# Patient Record
Sex: Male | Born: 1960 | Race: Black or African American | Hispanic: No | Marital: Married | Smoking: Never smoker
Health system: Southern US, Community
[De-identification: ages and names within clinical notes are randomized; demographics above are authoritative.]

## PROBLEM LIST (undated history)

## (undated) DIAGNOSIS — E119 Type 2 diabetes mellitus without complications: Secondary | ICD-10-CM

---

## 2000-09-17 ENCOUNTER — Emergency Department (HOSPITAL_COMMUNITY): Admission: EM | Admit: 2000-09-17 | Discharge: 2000-09-18 | Payer: Self-pay | Admitting: Emergency Medicine

## 2000-09-18 ENCOUNTER — Encounter: Payer: Self-pay | Admitting: Emergency Medicine

## 2006-07-28 ENCOUNTER — Encounter: Admission: RE | Admit: 2006-07-28 | Discharge: 2006-10-26 | Payer: Self-pay | Admitting: Family Medicine

## 2007-01-14 ENCOUNTER — Ambulatory Visit: Payer: Self-pay | Admitting: Internal Medicine

## 2007-01-14 LAB — CONVERTED CEMR LAB
ALT: 36 units/L (ref 0–40)
AST: 25 units/L (ref 0–37)
Albumin: 3.7 g/dL (ref 3.5–5.2)
Alkaline Phosphatase: 38 units/L — ABNORMAL LOW (ref 39–117)
BUN: 11 mg/dL (ref 6–23)
Basophils Absolute: 0 10*3/uL (ref 0.0–0.1)
Basophils Relative: 0.6 % (ref 0.0–1.0)
Bilirubin, Direct: 0.1 mg/dL (ref 0.0–0.3)
CO2: 30 meq/L (ref 19–32)
Calcium: 9.2 mg/dL (ref 8.4–10.5)
Chloride: 108 meq/L (ref 96–112)
Cholesterol: 216 mg/dL (ref 0–200)
Creatinine, Ser: 1.3 mg/dL (ref 0.4–1.5)
Direct LDL: 145.1 mg/dL
Eosinophils Absolute: 0.2 10*3/uL (ref 0.0–0.6)
Eosinophils Relative: 4 % (ref 0.0–5.0)
GFR calc Af Amer: 77 mL/min
GFR calc non Af Amer: 64 mL/min
Glucose, Bld: 112 mg/dL — ABNORMAL HIGH (ref 70–99)
HCT: 43.3 % (ref 39.0–52.0)
HDL: 31.9 mg/dL — ABNORMAL LOW (ref >39.0)
Hemoglobin: 14.5 g/dL (ref 13.0–17.0)
Hgb A1c MFr Bld: 6.6 % — ABNORMAL HIGH (ref 4.6–6.0)
Lymphocytes Relative: 31.6 % (ref 12.0–46.0)
MCHC: 33.6 g/dL (ref 30.0–36.0)
MCV: 92.5 fL (ref 78.0–100.0)
Monocytes Absolute: 0.6 10*3/uL (ref 0.2–0.7)
Monocytes Relative: 15.4 % — ABNORMAL HIGH (ref 3.0–11.0)
Neutro Abs: 1.9 10*3/uL (ref 1.4–7.7)
Neutrophils Relative %: 48.4 % (ref 43.0–77.0)
PSA: 0.74 ng/mL (ref 0.10–4.00)
Platelets: 230 10*3/uL (ref 150–400)
Potassium: 3.5 meq/L (ref 3.5–5.1)
RBC: 4.68 M/uL (ref 4.22–5.81)
RDW: 11.7 % (ref 11.5–14.6)
Sodium: 143 meq/L (ref 135–145)
TSH: 0.82 microintl units/mL (ref 0.35–5.50)
Total Bilirubin: 0.7 mg/dL (ref 0.3–1.2)
Total CHOL/HDL Ratio: 6.8
Total Protein: 6.8 g/dL (ref 6.0–8.3)
Triglycerides: 183 mg/dL — ABNORMAL HIGH (ref 0–149)
VLDL: 37 mg/dL (ref 0–40)
WBC: 3.9 10*3/uL — ABNORMAL LOW (ref 4.5–10.5)

## 2014-08-04 ENCOUNTER — Encounter (HOSPITAL_COMMUNITY): Payer: Self-pay

## 2014-08-04 ENCOUNTER — Emergency Department (HOSPITAL_COMMUNITY): Payer: Managed Care, Other (non HMO)

## 2014-08-04 ENCOUNTER — Observation Stay (HOSPITAL_COMMUNITY)
Admission: EM | Admit: 2014-08-04 | Discharge: 2014-08-05 | Disposition: A | Discharge status: Home / Self Care | Payer: Managed Care, Other (non HMO) | Attending: Internal Medicine | Admitting: Internal Medicine

## 2014-08-04 DIAGNOSIS — R0902 Hypoxemia: Secondary | ICD-10-CM | POA: Diagnosis present

## 2014-08-04 DIAGNOSIS — R61 Generalized hyperhidrosis: Secondary | ICD-10-CM | POA: Insufficient documentation

## 2014-08-04 DIAGNOSIS — R112 Nausea with vomiting, unspecified: Secondary | ICD-10-CM

## 2014-08-04 DIAGNOSIS — R110 Nausea: Secondary | ICD-10-CM | POA: Insufficient documentation

## 2014-08-04 DIAGNOSIS — R109 Unspecified abdominal pain: Secondary | ICD-10-CM | POA: Insufficient documentation

## 2014-08-04 DIAGNOSIS — E119 Type 2 diabetes mellitus without complications: Secondary | ICD-10-CM

## 2014-08-04 DIAGNOSIS — N529 Male erectile dysfunction, unspecified: Secondary | ICD-10-CM | POA: Diagnosis not present

## 2014-08-04 DIAGNOSIS — Z79899 Other long term (current) drug therapy: Secondary | ICD-10-CM | POA: Insufficient documentation

## 2014-08-04 DIAGNOSIS — R55 Syncope and collapse: Principal | ICD-10-CM | POA: Diagnosis present

## 2014-08-04 DIAGNOSIS — R569 Unspecified convulsions: Secondary | ICD-10-CM | POA: Insufficient documentation

## 2014-08-04 DIAGNOSIS — R7989 Other specified abnormal findings of blood chemistry: Secondary | ICD-10-CM

## 2014-08-04 HISTORY — DX: Type 2 diabetes mellitus without complications: E11.9

## 2014-08-04 LAB — CBC
HCT: 41.7 % (ref 39.0–52.0)
HEMOGLOBIN: 14 g/dL (ref 13.0–17.0)
MCH: 29.6 pg (ref 26.0–34.0)
MCHC: 33.6 g/dL (ref 30.0–36.0)
MCV: 88.2 fL (ref 78.0–100.0)
Platelets: 230 10*3/uL (ref 150–400)
RBC: 4.73 MIL/uL (ref 4.22–5.81)
RDW: 11.9 % (ref 11.5–15.5)
WBC: 5.9 10*3/uL (ref 4.0–10.5)

## 2014-08-04 LAB — BASIC METABOLIC PANEL
Anion gap: 16 — ABNORMAL HIGH (ref 5–15)
BUN: 15 mg/dL (ref 6–23)
CHLORIDE: 98 meq/L (ref 96–112)
CO2: 24 mEq/L (ref 19–32)
Calcium: 9.2 mg/dL (ref 8.4–10.5)
Creatinine, Ser: 1.17 mg/dL (ref 0.50–1.35)
GFR calc Af Amer: 82 mL/min — ABNORMAL LOW (ref >90)
GFR calc non Af Amer: 71 mL/min — ABNORMAL LOW (ref >90)
GLUCOSE: 183 mg/dL — AB (ref 70–99)
POTASSIUM: 3.3 meq/L — AB (ref 3.7–5.3)
Sodium: 138 mEq/L (ref 137–147)

## 2014-08-04 MED ORDER — IPRATROPIUM-ALBUTEROL 0.5-2.5 (3) MG/3ML IN SOLN
3.0000 mL | Freq: Once | RESPIRATORY_TRACT | Status: AC
  Administered 2014-08-04: 3 mL via RESPIRATORY_TRACT

## 2014-08-04 NOTE — ED Provider Notes (Signed)
CSN: [id number]     Arrival date & time 08/04/14  2232 History  This chart was scribed for Keith BarretteMarcy Hanish Laraia, MD by Roxy Cedarhandni Bhalodia, ED Scribe. This patient was seen in room B14C/B14C and the patient's care was started at 12:18 AM.   Chief Complaint  Patient presents with  . Seizures   Patient is a 53 y.o. male presenting with seizures. The history is provided by the patient. No language interpreter was used.  Seizures  HPI Comments: Keith Larsen Keith Larsen is a 53 y.o. male with a history of diabetes, who presents to the Emergency Department complaining of a syncope episode that occurred prior to arrival. 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 He states that he did not eat enough throughout the day, although he does report eating. Per wife, patient was in the car and wife was driving out to go shopping when patient stated "I feel funny". He bent over stating that his "stomach was hurting" and he felt nauseous. Per wife, patient began gagging. Per wife, the patient's head fell back and patient became unresponsive for about 3-4 minutes. His head and eyes rolled back, he didn't seem to be breathing, his wife denies any twitching or convulsing of his extremities. No loss of bladder control no tongue laceration. Wife states that he seemed like he was not breathing when he became unresponsive. He reports associated visual disturbance and states that "everything seemed bright and blue". When patient gained consciousness, he was aware of his surroundings and states that he felt weak. Patient currently reports dizziness. Patient denies any prior history of seizures. He denies associated chest pain. He states that his systolic BP when measured at home was 130. No recent illness. Patient was asymptomatic through the day.  Past Medical History  Diagnosis Date  . Diabetes mellitus without complication    History reviewed. No pertinent past surgical history. History reviewed. No pertinent  family history. History  Substance Use Topics  . Smoking status: Never Smoker   . Smokeless tobacco: Not on file  . Alcohol Use: Yes     Comment: occasionally   Review of Systems  Eyes: Positive for visual disturbance.  Cardiovascular: Negative for chest pain and leg swelling.  Neurological: Positive for dizziness, seizures and syncope. Negative for numbness.  10 Systems reviewed and are negative for acute change except as noted in the HPI. Allergies  Review of patient's allergies indicates no known allergies.  Home Medications   Prior to Admission medications   Medication Sig Start Date End Date Taking? Authorizing Provider  glyBURIDE-metformin (GLUCOVANCE) 5-500 MG per tablet Take 1 tablet by mouth 2 (two) times daily with a meal.   Yes Historical Provider, MD  Multiple Vitamin (MULTIVITAMIN WITH MINERALS) TABS tablet Take 1 tablet by mouth daily.   Yes Historical Provider, MD   Triage Vitals: BP 99/67 mmHg  Pulse 80  Temp(Src) 98.2 F (36.8 C) (Oral)  Resp 24  SpO2 94%  Physical Exam  Constitutional: He is oriented to person, place, and time. He appears well-developed and well-nourished.  HENT:  Head: Normocephalic and atraumatic.  Right Ear: External ear normal.  Left Ear: External ear normal.  Nose: Nose normal.  Mouth/Throat: No oropharyngeal exudate.  Eyes: EOM are normal. Pupils are equal, round, and reactive to light.  Neck: Neck supple. No thyromegaly present.  Cardiovascular: Normal rate, regular rhythm, normal heart sounds and intact distal pulses.   Pulmonary/Chest: Effort normal and breath sounds normal.  Abdominal: Soft. Bowel  sounds are normal. He exhibits no distension. There is no tenderness.  Musculoskeletal: Normal range of motion. He exhibits no edema.  Lymphadenopathy:    He has no cervical adenopathy.  Neurological: He is alert and oriented to person, place, and time. He has normal strength. No cranial nerve deficit. He exhibits normal muscle tone.  Coordination normal. GCS eye subscore is 4. GCS verbal subscore is 5. GCS motor subscore is 6.  Skin: Skin is warm, dry and intact.  Psychiatric: He has a normal mood and affect.    ED Course  Procedures   Labs Review Labs Reviewed  BASIC METABOLIC PANEL - Abnormal; Notable for the following:    Potassium 3.3 (*)    Glucose, Bld 183 (*)    GFR calc non Af Amer 71 (*)    GFR calc Af Amer 82 (*)    Anion gap 16 (*)    All other components within normal limits  CBC  HEPATIC FUNCTION PANEL  LIPASE, BLOOD  PROTIME-INR  CBG MONITORING, ED  I-STAT TROPOININ, ED   Imaging Review Ct Head Wo Contrast   (if New Onset Seizure And/or Head Trauma)  08/04/2014   CLINICAL DATA:  Syncope. Fell today. History of diabetes. Seizures.  EXAM: CT HEAD WITHOUT CONTRAST  TECHNIQUE: Contiguous axial images were obtained from the base of the skull through the vertex without intravenous contrast.  COMPARISON:  None.  FINDINGS: Ventricles and sulci appear symmetrical. No mass effect or midline shift. No abnormal extra-axial fluid collections. Gray-white matter junctions are distinct. Basal cisterns are not effaced. No evidence of acute intracranial hemorrhage. No depressed skull fractures. Mucous retention cysts in the left maxillary antrum. Intracranial vascular calcifications. Mastoid air cells are not opacified.  IMPRESSION: No acute intracranial abnormalities. Mucous retention cysts in the left maxillary antrum.   Electronically Signed   By: Burman NievesWilliam  Stevens M.D.   On: 08/04/2014 23:33   Dg Chest Port 1 View  08/05/2014   CLINICAL DATA:  Syncopal episode. Nausea. History of diabetes. Initial encounter.  EXAM: PORTABLE CHEST - 1 VIEW  COMPARISON:  None.  FINDINGS: 0033 hr. The positioning is lordotic. The heart size and mediastinal contours are normal. There is elevation of the right hemidiaphragm with associated mild right basilar atelectasis. The left lung is clear. There is no pleural effusion or  pneumothorax. No acute osseous findings are evident. Telemetry leads overlie the chest.  IMPRESSION: No acute cardiopulmonary process. Mild right basilar atelectasis adjacent to asymmetric elevation of the right hemidiaphragm.   Electronically Signed   By: Roxy HorsemanBill  Veazey M.D.   On: 08/05/2014 00:44     EKG Interpretation   Date/Time:  Saturday August 04 2014 22:37:39 EST Ventricular Rate:  75 PR Interval:  142 QRS Duration: 98 QT Interval:  614 QTC Calculation: 686 R Axis:   53 Text Interpretation:  Sinus rhythm Borderline T wave abnormalities  Prolonged QT interval Baseline wander in lead(s) III agree. abnormal  Confirmed by Donnald GarrePfeiffer, MD, Lebron ConnersMarcy [phone]([id number]) on 08/05/2014 2:09:03 AM      Consult: The patient's case is consult with Dr. Allena KatzPatel of [facility], the patient will be admitted for observation. MDM   Final diagnoses:  Syncope   By description this sounds more like a syncopal episode than a seizure. Patient will be admitted for monitoring and observation.  I personally performed the services described in this documentation, which was scribed in my presence. The recorded information has been reviewed and is accurate.  Keith BarretteMarcy Niles Ess, MD 08/05/14 [phone]

## 2014-08-04 NOTE — ED Notes (Addendum)
Per wife, pt and her were driving out to go shopping when pt stated "I feel funny".  His head reportedly fell back and pt became unresponsive for about "3-4 minutes" with seizure like activity; pt with no hx of seizures per wife; pt is a diabetic.  Pt sts he currently feels dizzy.  Pt somnolent in department and unsteady on feet when standing up.

## 2014-08-05 ENCOUNTER — Observation Stay (HOSPITAL_COMMUNITY): Payer: Managed Care, Other (non HMO)

## 2014-08-05 ENCOUNTER — Emergency Department (HOSPITAL_COMMUNITY): Payer: Managed Care, Other (non HMO)

## 2014-08-05 DIAGNOSIS — E118 Type 2 diabetes mellitus with unspecified complications: Secondary | ICD-10-CM

## 2014-08-05 DIAGNOSIS — R0902 Hypoxemia: Secondary | ICD-10-CM | POA: Diagnosis present

## 2014-08-05 DIAGNOSIS — R55 Syncope and collapse: Secondary | ICD-10-CM

## 2014-08-05 DIAGNOSIS — I517 Cardiomegaly: Secondary | ICD-10-CM

## 2014-08-05 DIAGNOSIS — E119 Type 2 diabetes mellitus without complications: Secondary | ICD-10-CM

## 2014-08-05 LAB — PROTIME-INR
INR: 1.03 (ref 0.00–1.49)
Prothrombin Time: 13.6 seconds (ref 11.6–15.2)

## 2014-08-05 LAB — HEPATIC FUNCTION PANEL
ALBUMIN: 3.5 g/dL (ref 3.5–5.2)
ALK PHOS: 54 U/L (ref 39–117)
ALT: 40 U/L (ref 0–53)
AST: 28 U/L (ref 0–37)
BILIRUBIN TOTAL: 0.4 mg/dL (ref 0.3–1.2)
Bilirubin, Direct: <0.2 mg/dL (ref 0.0–0.3)
Total Protein: 7.3 g/dL (ref 6.0–8.3)

## 2014-08-05 LAB — I-STAT TROPONIN, ED: Troponin i, poc: 0.01 ng/mL (ref 0.00–0.08)

## 2014-08-05 LAB — CBG MONITORING, ED: Glucose-Capillary: 163 mg/dL — ABNORMAL HIGH (ref 70–99)

## 2014-08-05 LAB — HEMOGLOBIN A1C
HEMOGLOBIN A1C: 8.9 % — AB (ref <5.7)
MEAN PLASMA GLUCOSE: 209 mg/dL — AB (ref <117)

## 2014-08-05 LAB — TROPONIN I: Troponin I: <0.3 ng/mL (ref <0.30)

## 2014-08-05 LAB — GLUCOSE, CAPILLARY
Glucose-Capillary: 184 mg/dL — ABNORMAL HIGH (ref 70–99)
Glucose-Capillary: 134 mg/dL — ABNORMAL HIGH (ref 70–99)
Glucose-Capillary: 210 mg/dL — ABNORMAL HIGH (ref 70–99)

## 2014-08-05 LAB — LACTIC ACID, PLASMA: Lactic Acid, Venous: 1.8 mmol/L (ref 0.5–2.2)

## 2014-08-05 LAB — D-DIMER, QUANTITATIVE (NOT AT ARMC): D DIMER QUANT: 0.51 ug{FEU}/mL — AB (ref 0.00–0.48)

## 2014-08-05 LAB — LIPASE, BLOOD
Lipase: 36 U/L (ref 11–59)
Lipase: 23 U/L (ref 11–59)

## 2014-08-05 LAB — PRO B NATRIURETIC PEPTIDE: PRO B NATRI PEPTIDE: 9.4 pg/mL (ref 0–125)

## 2014-08-05 MED ORDER — ONDANSETRON HCL 4 MG/2ML IJ SOLN: 4.0000 mg | Freq: Four times a day (QID) | INTRAMUSCULAR | Status: DC | PRN

## 2014-08-05 MED ORDER — ACETAMINOPHEN 650 MG RE SUPP: 650.0000 mg | Freq: Four times a day (QID) | RECTAL | Status: DC | PRN

## 2014-08-05 MED ORDER — SODIUM CHLORIDE 0.9 % IV SOLN
INTRAVENOUS | Status: AC
  Administered 2014-08-05: 04:00:00 via INTRAVENOUS

## 2014-08-05 MED ORDER — POTASSIUM CHLORIDE CRYS ER 20 MEQ PO TBCR
40.0000 meq | EXTENDED_RELEASE_TABLET | Freq: Once | ORAL | Status: AC
  Administered 2014-08-05: 40 meq via ORAL
  Filled 2014-08-05: qty 2

## 2014-08-05 MED ORDER — ACETAMINOPHEN 325 MG PO TABS: 650.0000 mg | ORAL_TABLET | Freq: Four times a day (QID) | ORAL | Status: DC | PRN

## 2014-08-05 MED ORDER — ENOXAPARIN SODIUM 60 MG/0.6ML ~~LOC~~ SOLN
60.0000 mg | SUBCUTANEOUS | Status: DC
  Filled 2014-08-05: qty 0.6

## 2014-08-05 MED ORDER — INSULIN ASPART 100 UNIT/ML ~~LOC~~ SOLN
0.0000 [IU] | Freq: Three times a day (TID) | SUBCUTANEOUS | Status: DC
  Administered 2014-08-05: 2 [IU] via SUBCUTANEOUS
  Administered 2014-08-05: 5 [IU] via SUBCUTANEOUS
  Administered 2014-08-05: 3 [IU] via SUBCUTANEOUS

## 2014-08-05 MED ORDER — INSULIN ASPART 100 UNIT/ML ~~LOC~~ SOLN: 0.0000 [IU] | Freq: Every day | SUBCUTANEOUS | Status: DC

## 2014-08-05 MED ORDER — ONDANSETRON HCL 4 MG PO TABS: 4.0000 mg | ORAL_TABLET | Freq: Four times a day (QID) | ORAL | Status: DC | PRN

## 2014-08-05 MED ORDER — IOHEXOL 350 MG/ML SOLN
75.0000 mL | Freq: Once | INTRAVENOUS | Status: AC | PRN
  Administered 2014-08-05: 75 mL via INTRAVENOUS

## 2014-08-05 MED ORDER — SODIUM CHLORIDE 0.9 % IJ SOLN: 3.0000 mL | Freq: Two times a day (BID) | INTRAMUSCULAR | Status: DC

## 2014-08-05 NOTE — ED Notes (Signed)
Admitting MD at BS.  

## 2014-08-05 NOTE — Progress Notes (Signed)
*  PRELIMINARY RESULTS* Vascular Ultrasound Lower extremity venous duplex has been completed.  Preliminary findings: no evidence of DVT  Farrel DemarkJill Eunice, RDMS, RVT  08/05/2014, 11:11 AM

## 2014-08-05 NOTE — ED Notes (Signed)
Transporting patient to new room assignment. 

## 2014-08-05 NOTE — Progress Notes (Signed)
  Echocardiogram 2D Echocardiogram has been performed.  Keith Larsen, Keith Larsen 08/05/2014, 12:21 PM

## 2014-08-05 NOTE — H&P (Signed)
[facility] History and Physical  Patient: Keith Larsen  ZOX:[id number]  DOB: 11/07/1960  DOS: the patient was seen and examined on 08/05/2014 PCP: No primary care provider on file.  Chief Complaint: unconscious episode  HPI: Keith Larsen is a 53 y.o. male with Past medical history of diabetes mellitus. The patient is presenting with Unresponsive episode. The episode that has been described by patient is he was in the car with his wife while in the car patient mentions that he was feeling funny later on he started having diffuse sharp abdominal pain then he felt nauseous and diaphoretic and then passed out. He was breathing as if he was gasping for air and was unresponsive for a few minutes with eyes rolled back. He also had some twitching or tremors of the whole body. There was no loss of bowel or bladder function or tongue bite the patient woke up on his own and was not confused. He complained of some fatigue and dizziness and they brought him here in the hospital. While in the hospital patient had another episode of similar symptoms with an episode of vomiting. His blood pressure went to 90s and oxygenation went down with that episode. Patient denies any similar episode in the past. Denies any chest pain shortness of breath orthopnea PND weight gain diarrhea constipation in the past. Patient denies any changes in his medication. 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 He has noted some ankle swelling every now and then.  The patient is coming from home. And at his baseline independent for most of his ADL.  Review of Systems: as mentioned in the history of present illness.  A Comprehensive review of the other systems is negative.  Past Medical History  Diagnosis Date  . Diabetes mellitus without complication    History reviewed. No pertinent past surgical history. Social History:  reports that he has never smoked. He  does not have any smokeless tobacco history on file. He reports that he drinks alcohol. His drug history is not on file.  No Known Allergies  History reviewed. No pertinent family history.  Prior to Admission medications   Medication Sig Start Date End Date Taking? Authorizing Provider  glyBURIDE-metformin (GLUCOVANCE) 5-500 MG per tablet Take 1 tablet by mouth 2 (two) times daily with a meal.   Yes Historical Provider, MD  Multiple Vitamin (MULTIVITAMIN WITH MINERALS) TABS tablet Take 1 tablet by mouth daily.   Yes Historical Provider, MD    Physical Exam: Filed Vitals:   08/05/14 0415 08/05/14 0455 08/05/14 0500 08/05/14 0525  BP: 97/70 115/65 114/72 110/71  Pulse: 83 85 83 90  Temp:    98 F (36.7 C)  TempSrc:    Oral  Resp: 22 19 18 20   Height:    6\' 1"  (1.854 m)  Weight:    131.543 kg (290 lb)  SpO2: 92% 94% 92% 98%    General: Alert, Awake and Oriented to Time, Place and Person. Appear in mild distress Eyes: PERRL ENT: Oral Mucosa clear moist. Neck: difficult to assess JVD Cardiovascular: S1 and S2 Present, no Murmur, Peripheral Pulses Present Respiratory: Bilateral Air entry equal and Decreased, bilateral Crackles, no wheezes Abdomen: Bowel Sound present, Soft and non tender Skin: no Rash Extremities: bilateral Pedal edema, no calf tenderness Neurologic: Grossly no focal neuro deficit.  Labs on Admission:  CBC:  Recent Labs Lab 08/04/14 2243  WBC 5.9  HGB 14.0  HCT  41.7  MCV 88.2  PLT 230    CMP     Component Value Date/Time   NA 138 08/04/2014 2243   K 3.3* 08/04/2014 2243   CL 98 08/04/2014 2243   CO2 24 08/04/2014 2243   GLUCOSE 183* 08/04/2014 2243   BUN 15 08/04/2014 2243   CREATININE 1.17 08/04/2014 2243   CALCIUM 9.2 08/04/2014 2243   PROT 7.3 08/04/2014 2243   ALBUMIN 3.5 08/04/2014 2243   AST 28 08/04/2014 2243   ALT 40 08/04/2014 2243   ALKPHOS 54 08/04/2014 2243   BILITOT 0.4 08/04/2014 2243   GFRNONAA 71* 08/04/2014 2243   GFRAA  82* 08/04/2014 2243     Recent Labs Lab 08/04/14 2243 08/05/14 0410  LIPASE 36 23   No results for input(s): AMMONIA in the last 168 hours.   Recent Labs Lab 08/05/14 0410  TROPONINI <0.30   BNP (last 3 results)  Recent Labs  08/05/14 0410  PROBNP 9.4    Radiological Exams on Admission: Ct Head Wo Contrast   (if New Onset Seizure And/or Head Trauma)  08/04/2014   CLINICAL DATA:  Syncope. Fell today. History of diabetes. Seizures.  EXAM: CT HEAD WITHOUT CONTRAST  TECHNIQUE: Contiguous axial images were obtained from the base of the skull through the vertex without intravenous contrast.  COMPARISON:  None.  FINDINGS: Ventricles and sulci appear symmetrical. No mass effect or midline shift. No abnormal extra-axial fluid collections. Gray-white matter junctions are distinct. Basal cisterns are not effaced. No evidence of acute intracranial hemorrhage. No depressed skull fractures. Mucous retention cysts in the left maxillary antrum. Intracranial vascular calcifications. Mastoid air cells are not opacified.  IMPRESSION: No acute intracranial abnormalities. Mucous retention cysts in the left maxillary antrum.   Electronically Signed   By: Burman NievesWilliam  Stevens M.D.   On: 08/04/2014 23:33   Dg Chest Port 1 View  08/05/2014   CLINICAL DATA:  Syncopal episode. Nausea. History of diabetes. Initial encounter.  EXAM: PORTABLE CHEST - 1 VIEW  COMPARISON:  None.  FINDINGS: 0033 hr. The positioning is lordotic. The heart size and mediastinal contours are normal. There is elevation of the right hemidiaphragm with associated mild right basilar atelectasis. The left lung is clear. There is no pleural effusion or pneumothorax. No acute osseous findings are evident. Telemetry leads overlie the chest.  IMPRESSION: No acute cardiopulmonary process. Mild right basilar atelectasis adjacent to asymmetric elevation of the right hemidiaphragm.   Electronically Signed   By: Roxy HorsemanBill  Veazey M.D.   On: 08/05/2014 00:44    Dg Abd Acute W/chest  08/05/2014   CLINICAL DATA:  Started with severe abdominal pain patient felt funny and head fell back and patient became unresponsive for 3-4 min with seizure like activity. Abdominal pain, nausea and vomiting, hypoxia.  EXAM: ACUTE ABDOMEN SERIES (ABDOMEN 2 VIEW & CHEST 1 VIEW)  COMPARISON:  Chest 08/05/2014  FINDINGS: Shallow inspiration with atelectasis or infiltration in the lung bases. Heart size and pulmonary vascularity are normal for technique. No blunting of costophrenic angles. No pneumothorax.  Scattered gas and stool in the colon. No small or large bowel distention. No free intra-abdominal air. No abnormal air-fluid levels. No radiopaque stones. Visualized bones appear intact.  IMPRESSION: Shallow inspiration with atelectasis or infiltration in the lung bases. Nonobstructive bowel gas pattern.   Electronically Signed   By: Burman NievesWilliam  Stevens M.D.   On: 08/05/2014 04:49    EKG: Independently reviewed. normal sinus rhythm, diffuse T wave inversion.  Assessment/Plan Principal Problem:  Syncope Active Problems:   Hypoxia   Type 2 diabetes mellitus   1. Syncope The patient is presenting with a syncopal episode. He denies any prior cardiac issues or any other abnormality. His EKG shows diffuse T-wave inversions. He has mildly elevated d-dimer. He is hypoxic and hypotensive. Chest x-ray shows that he has basilar atelectasis. He has basal crackles on examination. No abdominal tenderness but symptoms starts with the pain diffuse in the abdomen. With this he has normal lipase and normal lactic acid on recheck as well. Probable etiology is vasovagal syncope but possibility of PE cannot be ruled out due to his history therefore I will obtain a CT chest angiogram as well as lower extremity Doppler. Due to his syncopal episode which could also represent a cardiac event I would continue monitoring him on telemetry and follow serial troponin as well as obtain  echocardiogram.  2. Hypoxia. Etiology currently unclear possible atelectasis versus possible PE versus possible infection. Incentive spirometry. Follow the workup.  3. Type 2 diabetes mellitus. Holding patient's oral hypoglycemic agents and placing him on sliding scale. Patient at present is nothing by mouth.  Advance goals of care discussion: full code  DVT Prophylaxis: subcutaneous Heparin Nutrition: npo  Family Communication: family was present at bedside, opportunity was given to ask question and all questions were answered satisfactorily at the time of interview. Disposition: Admitted to inpatient in telemetry unit.  Author: Lynden Oxford, MD [facility] Pager: [phone] 08/05/2014, 5:35 AM    If 7PM-7AM, please contact night-coverage [email or web address] Password [email or web address]

## 2014-08-05 NOTE — Plan of Care (Signed)
Problem: Phase I Progression Outcomes Goal: Pain controlled with appropriate interventions Outcome: Completed/Met Date Met:  08/05/14     

## 2014-08-05 NOTE — Discharge Instructions (Signed)
Please follow up with Primary care doctor in 3 days Please follow up with cancer center at [facility] in 1 week

## 2014-08-05 NOTE — Discharge Summary (Signed)
Physician Discharge Summary  Ginette Pitmanaul J Batt FAO:[id number] DOB: 02/07/1961 DOA: 08/04/2014  PCP: No primary care provider on file.  Admit date: 08/04/2014 Discharge date: 08/05/2014  Time spent: >35 minutes  Recommendations for Outpatient Follow-up:  F/u with PCP 3-7 days F/u with hem/Oncology in 1 week  Discharge Diagnoses:  Principal Problem:   Syncope Active Problems:   Hypoxia   Type 2 diabetes mellitus   Discharge Condition: stable   Diet recommendation: DM  Filed Weights   08/05/14 0328 08/05/14 0525  Weight: 130.636 kg (288 lb) 131.543 kg (290 lb)    History of present illness:  53 y.o. male with Past medical history of diabetes mellitus presented with Unresponsive episode. The episode that has been described by patient is he was in the car with his wife while in the car patient mentions that he was feeling funny later on he started having diffuse sharp abdominal pain then he felt nauseous and diaphoretic and then passed out.   Hospital Course:  1. Syncope ? Vasovagal vs medication related (Patient reports taking unknown medition for erectile dysfunction 6 hours prior to syncope) -no new symptoms, neuro exam no focal; CT head: no acute findings -echo: LVEF: 60-65%, clinically euvolemic no arrhythmia on tele   2. Mild elevated elevated d dimer; CTA: no PE; leg US no DVT 3. CT chest: Mediastinal, bilateral hilar, and bilateral axillary lymphadenopathy. Findings areconcerning for lymphoma or metastatic disease -d/w patient at length the CT findings, I have recommended to further investigate with oncology consultation; patient prefers to f/u with oncology, and PCP as outpatient for further investigation  4. DM, unclear history; no episodes of hypoglycemia in the hospital; ; pend HA1;   D/w patient again, recommended to monitor overnight; But he prefers to go home and f/u with PCP this week for further management   Procedures:  Echo  Study Conclusions  - Left  ventricle: The cavity size was mildly dilated. Wall thickness was normal. Systolic function was normal. The estimated ejection fraction was in the range of 60% to 65%. Wall motion was normal; there were no regional wall motion abnormalities. Doppler parameters are consistent with abnormal left ventricular relaxation (grade 1 diastolic dysfunction).  Impressions:  - Normal LV function; grade 1 diastolic dysfunction; mild LVE; no significant valvular regurgitation.(i.e. Studies not automatically included, echos, thoracentesis, etc; not x-rays)  Consultations:  none  Discharge Exam: Filed Vitals:   08/05/14 1604  BP: 125/78  Pulse: 88  Temp: 98.4 F (36.9 C)  Resp: 20    General: alert Cardiovascular: s1,s2 rrr Respiratory: CTA BL  Discharge Instructions  Discharge Instructions    Diet - low sodium heart healthy    Complete by:  As directed      Discharge instructions    Complete by:  As directed   Please follow up with Primary care doctor in 3 days Please follow up with cancer center at [facility] in 1 week     Increase activity slowly    Complete by:  As directed             Medication List    TAKE these medications        glyBURIDE-metformin 5-500 MG per tablet  Commonly known as:  GLUCOVANCE  Take 1 tablet by mouth 2 (two) times daily with a meal.     multivitamin with minerals Tabs tablet  Take 1 tablet by mouth daily.       No Known Allergies     Follow-up  Information    Follow up with [facility]    . Schedule an appointment as soon as possible for a visit in 3 days.   Contact information:   [address] [place] [address] [phone]      Follow up with [facility] Schedule an appointment as soon as possible for a visit in 1 week.       The results of significant diagnostics from this hospitalization (including imaging, microbiology, ancillary and  laboratory) are listed below for reference.    Significant Diagnostic Studies: Ct Head Wo Contrast   (if New Onset Seizure And/or Head Trauma)  08/04/2014   CLINICAL DATA:  Syncope. Fell today. History of diabetes. Seizures.  EXAM: CT HEAD WITHOUT CONTRAST  TECHNIQUE: Contiguous axial images were obtained from the base of the skull through the vertex without intravenous contrast.  COMPARISON:  None.  FINDINGS: Ventricles and sulci appear symmetrical. No mass effect or midline shift. No abnormal extra-axial fluid collections. Gray-white matter junctions are distinct. Basal cisterns are not effaced. No evidence of acute intracranial hemorrhage. No depressed skull fractures. Mucous retention cysts in the left maxillary antrum. Intracranial vascular calcifications. Mastoid air cells are not opacified.  IMPRESSION: No acute intracranial abnormalities. Mucous retention cysts in the left maxillary antrum.   Electronically Signed   By: Burman Nieves M.D.   On: 08/04/2014 23:33   Ct Angio Chest Pe W/cm &/or Wo Cm  08/05/2014   CLINICAL DATA:  Syncopal episode yesterday. Hypoxic on room air. Elevated D-dimer.  EXAM: CT ANGIOGRAPHY CHEST WITH CONTRAST  TECHNIQUE: Multidetector CT imaging of the chest was performed using the standard protocol during bolus administration of intravenous contrast. Multiplanar CT image reconstructions and MIPs were obtained to evaluate the vascular anatomy.  CONTRAST:  75mL OMNIPAQUE IOHEXOL 350 MG/ML SOLN  COMPARISON:  None.  FINDINGS: Technically adequate study with good opacification of central and segmental pulmonary arteries. No focal filling defects. No evidence of significant pulmonary embolus.  Normal heart size. Normal caliber thoracic aorta. No aortic dissection. Esophagus is decompressed. There is lymphadenopathy throughout the chest with enlarged lymph nodes in bilateral axillary, bilateral hilar, and mediastinal regions. Largest lymph nodes are present in the right  peritracheal region at 2.5 cm short axis dimension, in the subcarinal region at 3 cm short axis dimension, an in the left aortopulmonic window region at 1.8 cm short axis dimension. Appearance is worrisome for lymphoma or metastatic disease.  Atelectasis in the lung bases. No lung nodules are identified. No pleural effusions. No pneumothorax.  Mildly enlarged lymph nodes demonstrated in the visualize celiac axis and porta hepatis regions. Included portions of the upper abdominal organs are otherwise grossly unremarkable. No destructive bone lesions are identified.  Review of the MIP images confirms the above findings.  IMPRESSION: No evidence of significant pulmonary embolus. Mediastinal, bilateral hilar, and bilateral axillary lymphadenopathy. Findings are concerning for lymphoma or metastatic disease.   Electronically Signed   By: Burman Nieves M.D.   On: 08/05/2014 06:40   Dg Chest Port 1 View  08/05/2014   CLINICAL DATA:  Syncopal episode. Nausea. History of diabetes. Initial encounter.  EXAM: PORTABLE CHEST - 1 VIEW  COMPARISON:  None.  FINDINGS: 0033 hr. The positioning is lordotic. The heart size and mediastinal contours are normal. There is elevation of the right hemidiaphragm with associated mild right basilar atelectasis. The left lung is clear. There is no pleural effusion or pneumothorax. No acute osseous  findings are evident. Telemetry leads overlie the chest.  IMPRESSION: No acute cardiopulmonary process. Mild right basilar atelectasis adjacent to asymmetric elevation of the right hemidiaphragm.   Electronically Signed   By: Roxy HorsemanBill  Veazey M.D.   On: 08/05/2014 00:44   Dg Abd Acute W/chest  08/05/2014   CLINICAL DATA:  Started with severe abdominal pain patient felt funny and head fell back and patient became unresponsive for 3-4 min with seizure like activity. Abdominal pain, nausea and vomiting, hypoxia.  EXAM: ACUTE ABDOMEN SERIES (ABDOMEN 2 VIEW & CHEST 1 VIEW)  COMPARISON:  Chest  08/05/2014  FINDINGS: Shallow inspiration with atelectasis or infiltration in the lung bases. Heart size and pulmonary vascularity are normal for technique. No blunting of costophrenic angles. No pneumothorax.  Scattered gas and stool in the colon. No small or large bowel distention. No free intra-abdominal air. No abnormal air-fluid levels. No radiopaque stones. Visualized bones appear intact.  IMPRESSION: Shallow inspiration with atelectasis or infiltration in the lung bases. Nonobstructive bowel gas pattern.   Electronically Signed   By: Burman NievesWilliam  Stevens M.D.   On: 08/05/2014 04:49    Microbiology: No results found for this or any previous visit (from the past 240 hour(s)).   Labs: Basic Metabolic Panel:  Recent Labs Lab 08/04/14 2243  NA 138  K 3.3*  CL 98  CO2 24  GLUCOSE 183*  BUN 15  CREATININE 1.17  CALCIUM 9.2   Liver Function Tests:  Recent Labs Lab 08/04/14 2243  AST 28  ALT 40  ALKPHOS 54  BILITOT 0.4  PROT 7.3  ALBUMIN 3.5    Recent Labs Lab 08/04/14 2243 08/05/14 0410  LIPASE 36 23   No results for input(s): AMMONIA in the last 168 hours. CBC:  Recent Labs Lab 08/04/14 2243  WBC 5.9  HGB 14.0  HCT 41.7  MCV 88.2  PLT 230   Cardiac Enzymes:  Recent Labs Lab 08/05/14 0410  TROPONINI <0.30   BNP: BNP (last 3 results)  Recent Labs  08/05/14 0410  PROBNP 9.4   CBG:  Recent Labs Lab 08/04/14 2241 08/05/14 0642 08/05/14 1144 08/05/14 1605  GLUCAP 163* 184* 134* 210*       Signed:  Jonette MateBURIEV, Kendria Halberg N  [facility] 08/05/2014, 4:51 PM

## 2014-08-05 NOTE — Progress Notes (Signed)
[facility] PROGRESS NOTE  Ginette Pitmanaul J Gunnels [id number] DOB: 05/01/1961 DOA: 08/04/2014 PCP: No primary care provider on file.  Assessment/Plan: 53 y.o. male with Past medical history of diabetes mellitus presented with Unresponsive episode. The episode that has been described by patient is he was in the car with his wife while in the car patient mentions that he was feeling funny later on he started having diffuse sharp abdominal pain then he felt nauseous and diaphoretic and then passed out.   1. Syncope ? Vasovagal vs medication related (Patient reports taking unknown medition for erectile dysfunction 6 hours prior to syncope) -no new symptoms, neuro exam no focal; CT head: no acute findings -pend echo, monitor on tele; hold glyburide r/o hypoglycemia   2. Mild elevated elevated d dimer; CTA: no PE; pend leg US to r/o DVT 3. CT chest: Mediastinal, bilateral hilar, and bilateral axillary lymphadenopathy. Findings areconcerning for lymphoma or  metastatic disease -d/w patient at length the CT findings, I have recommended to further investigate with oncology consultation; patient prefers to f/u with oncology, and PCP as outpatient for further investigation     4. DM, unclear history; hold gliburide; check HA1c    Code Status: full Family Communication: d/w patient (indicate person spoken with, relationship, and if by phone, the number) Disposition Plan: home 24-48 hrs    Consultants:  none  Procedures:  none  Antibiotics:  none (indicate start date, and stop date if known)  HPI/Subjective: alert  Objective: Filed Vitals:   08/05/14 0525  BP: 110/71  Pulse: 90  Temp: 98 F (36.7 C)  Resp: 20   No intake or output data in the 24 hours ending 08/05/14 0948 Filed Weights   08/05/14 0328 08/05/14 0525  Weight: 130.636 kg (288 lb) 131.543 kg (290 lb)    Exam:   General:  alert  Cardiovascular: s1,s2 rrr  Respiratory: CTA BL  Abdomen: soft, obese,  nt  Musculoskeletal: no Leg edema   Data Reviewed: Basic Metabolic Panel:  Recent Labs Lab 08/04/14 2243  NA 138  K 3.3*  CL 98  CO2 24  GLUCOSE 183*  BUN 15  CREATININE 1.17  CALCIUM 9.2   Liver Function Tests:  Recent Labs Lab 08/04/14 2243  AST 28  ALT 40  ALKPHOS 54  BILITOT 0.4  PROT 7.3  ALBUMIN 3.5    Recent Labs Lab 08/04/14 2243 08/05/14 0410  LIPASE 36 23   No results for input(s): AMMONIA in the last 168 hours. CBC:  Recent Labs Lab 08/04/14 2243  WBC 5.9  HGB 14.0  HCT 41.7  MCV 88.2  PLT 230   Cardiac Enzymes:  Recent Labs Lab 08/05/14 0410  TROPONINI <0.30   BNP (last 3 results)  Recent Labs  08/05/14 0410  PROBNP 9.4   CBG:  Recent Labs Lab 08/04/14 2241 08/05/14 0642  GLUCAP 163* 184*    No results found for this or any previous visit (from the past 240 hour(s)).   Studies: Ct Head Wo Contrast   (if New Onset Seizure And/or Head Trauma)  08/04/2014   CLINICAL DATA:  Syncope. Fell today. History of diabetes. Seizures.  EXAM: CT HEAD WITHOUT CONTRAST  TECHNIQUE: Contiguous axial images were obtained from the base of the skull through the vertex without intravenous contrast.  COMPARISON:  None.  FINDINGS: Ventricles and sulci appear symmetrical. No mass effect or midline shift. No abnormal extra-axial fluid collections. Gray-white matter junctions are distinct. Basal cisterns are not effaced. No evidence of  acute intracranial hemorrhage. No depressed skull fractures. Mucous retention cysts in the left maxillary antrum. Intracranial vascular calcifications. Mastoid air cells are not opacified.  IMPRESSION: No acute intracranial abnormalities. Mucous retention cysts in the left maxillary antrum.   Electronically Signed   By: Burman NievesWilliam  Stevens M.D.   On: 08/04/2014 23:33   Ct Angio Chest Pe W/cm &/or Wo Cm  08/05/2014   CLINICAL DATA:  Syncopal episode yesterday. Hypoxic on room air. Elevated D-dimer.  EXAM: CT ANGIOGRAPHY  CHEST WITH CONTRAST  TECHNIQUE: Multidetector CT imaging of the chest was performed using the standard protocol during bolus administration of intravenous contrast. Multiplanar CT image reconstructions and MIPs were obtained to evaluate the vascular anatomy.  CONTRAST:  75mL OMNIPAQUE IOHEXOL 350 MG/ML SOLN  COMPARISON:  None.  FINDINGS: Technically adequate study with good opacification of central and segmental pulmonary arteries. No focal filling defects. No evidence of significant pulmonary embolus.  Normal heart size. Normal caliber thoracic aorta. No aortic dissection. Esophagus is decompressed. There is lymphadenopathy throughout the chest with enlarged lymph nodes in bilateral axillary, bilateral hilar, and mediastinal regions. Largest lymph nodes are present in the right peritracheal region at 2.5 cm short axis dimension, in the subcarinal region at 3 cm short axis dimension, an in the left aortopulmonic window region at 1.8 cm short axis dimension. Appearance is worrisome for lymphoma or metastatic disease.  Atelectasis in the lung bases. No lung nodules are identified. No pleural effusions. No pneumothorax.  Mildly enlarged lymph nodes demonstrated in the visualize celiac axis and porta hepatis regions. Included portions of the upper abdominal organs are otherwise grossly unremarkable. No destructive bone lesions are identified.  Review of the MIP images confirms the above findings.  IMPRESSION: No evidence of significant pulmonary embolus. Mediastinal, bilateral hilar, and bilateral axillary lymphadenopathy. Findings are concerning for lymphoma or metastatic disease.   Electronically Signed   By: Burman NievesWilliam  Stevens M.D.   On: 08/05/2014 06:40   Dg Chest Port 1 View  08/05/2014   CLINICAL DATA:  Syncopal episode. Nausea. History of diabetes. Initial encounter.  EXAM: PORTABLE CHEST - 1 VIEW  COMPARISON:  None.  FINDINGS: 0033 hr. The positioning is lordotic. The heart size and mediastinal contours are  normal. There is elevation of the right hemidiaphragm with associated mild right basilar atelectasis. The left lung is clear. There is no pleural effusion or pneumothorax. No acute osseous findings are evident. Telemetry leads overlie the chest.  IMPRESSION: No acute cardiopulmonary process. Mild right basilar atelectasis adjacent to asymmetric elevation of the right hemidiaphragm.   Electronically Signed   By: Roxy HorsemanBill  Veazey M.D.   On: 08/05/2014 00:44   Dg Abd Acute W/chest  08/05/2014   CLINICAL DATA:  Started with severe abdominal pain patient felt funny and head fell back and patient became unresponsive for 3-4 min with seizure like activity. Abdominal pain, nausea and vomiting, hypoxia.  EXAM: ACUTE ABDOMEN SERIES (ABDOMEN 2 VIEW & CHEST 1 VIEW)  COMPARISON:  Chest 08/05/2014  FINDINGS: Shallow inspiration with atelectasis or infiltration in the lung bases. Heart size and pulmonary vascularity are normal for technique. No blunting of costophrenic angles. No pneumothorax.  Scattered gas and stool in the colon. No small or large bowel distention. No free intra-abdominal air. No abnormal air-fluid levels. No radiopaque stones. Visualized bones appear intact.  IMPRESSION: Shallow inspiration with atelectasis or infiltration in the lung bases. Nonobstructive bowel gas pattern.   Electronically Signed   By: Burman NievesWilliam  Stevens M.D.   On:  08/05/2014 04:49    Scheduled Meds: . sodium chloride   Intravenous STAT  . enoxaparin (LOVENOX) injection  60 mg Subcutaneous Q24H  . insulin aspart  0-15 Units Subcutaneous TID WC  . insulin aspart  0-5 Units Subcutaneous QHS  . sodium chloride  3 mL Intravenous Q12H   Continuous Infusions:   Principal Problem:   Syncope Active Problems:   Hypoxia   Type 2 diabetes mellitus    Time spent: >35 minutes     Esperanza Sheets  [facility] Pager [phone]. If 7PM-7AM, please contact night-coverage at [email or web address], password [facility] 08/05/2014, 9:48 AM  LOS:  1 day

## 2014-08-05 NOTE — Progress Notes (Signed)
Utilization Review Completed.   Edinson Domeier, RN, BSN Nurse Case Manager  

## 2015-02-23 ENCOUNTER — Emergency Department (INDEPENDENT_AMBULATORY_CARE_PROVIDER_SITE_OTHER)
Admission: EM | Admit: 2015-02-23 | Discharge: 2015-02-23 | Disposition: A | Discharge status: Home / Self Care | Payer: Self-pay | Source: Home / Self Care | Attending: Family Medicine | Admitting: Family Medicine

## 2015-02-23 ENCOUNTER — Encounter (HOSPITAL_COMMUNITY): Payer: Self-pay | Admitting: Emergency Medicine

## 2015-02-23 DIAGNOSIS — M791 Myalgia, unspecified site: Secondary | ICD-10-CM

## 2015-02-23 DIAGNOSIS — S46812A Strain of other muscles, fascia and tendons at shoulder and upper arm level, left arm, initial encounter: Secondary | ICD-10-CM

## 2015-02-23 DIAGNOSIS — S29019A Strain of muscle and tendon of unspecified wall of thorax, initial encounter: Secondary | ICD-10-CM

## 2015-02-23 DIAGNOSIS — S29009A Unspecified injury of muscle and tendon of unspecified wall of thorax, initial encounter: Secondary | ICD-10-CM
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
## 2017-12-12 ENCOUNTER — Encounter (HOSPITAL_COMMUNITY)
Admission: EM | Disposition: A | Discharge status: Home / Self Care | Payer: Self-pay | Source: Home / Self Care | Attending: Emergency Medicine

## 2017-12-12 ENCOUNTER — Observation Stay (HOSPITAL_COMMUNITY)
Admission: EM | Admit: 2017-12-12 | Discharge: 2017-12-14 | Disposition: A | Discharge status: Home / Self Care | Payer: Managed Care, Other (non HMO) | Attending: Surgery | Admitting: Surgery

## 2017-12-12 ENCOUNTER — Encounter (HOSPITAL_COMMUNITY): Payer: Self-pay | Admitting: Nurse Practitioner

## 2017-12-12 ENCOUNTER — Emergency Department (HOSPITAL_COMMUNITY): Payer: Managed Care, Other (non HMO) | Admitting: Certified Registered Nurse Anesthetist

## 2017-12-12 ENCOUNTER — Other Ambulatory Visit: Payer: Self-pay

## 2017-12-12 ENCOUNTER — Emergency Department (HOSPITAL_COMMUNITY): Payer: Managed Care, Other (non HMO)

## 2017-12-12 DIAGNOSIS — E119 Type 2 diabetes mellitus without complications: Secondary | ICD-10-CM | POA: Insufficient documentation

## 2017-12-12 DIAGNOSIS — G4730 Sleep apnea, unspecified: Secondary | ICD-10-CM | POA: Diagnosis not present

## 2017-12-12 DIAGNOSIS — K420 Umbilical hernia with obstruction, without gangrene: Secondary | ICD-10-CM | POA: Diagnosis not present

## 2017-12-12 DIAGNOSIS — Z7984 Long term (current) use of oral hypoglycemic drugs: Secondary | ICD-10-CM | POA: Diagnosis not present

## 2017-12-12 DIAGNOSIS — Z6832 Body mass index (BMI) 32.0-32.9, adult: Secondary | ICD-10-CM | POA: Diagnosis not present

## 2017-12-12 DIAGNOSIS — R109 Unspecified abdominal pain: Secondary | ICD-10-CM | POA: Diagnosis present

## 2017-12-12 DIAGNOSIS — Z79899 Other long term (current) drug therapy: Secondary | ICD-10-CM | POA: Insufficient documentation

## 2017-12-12 HISTORY — PX: UMBILICAL HERNIA REPAIR: SHX196

## 2017-12-12 LAB — COMPREHENSIVE METABOLIC PANEL
ALK PHOS: 82 U/L (ref 38–126)
ALT: 25 U/L (ref 17–63)
AST: 21 U/L (ref 15–41)
Albumin: 3.5 g/dL (ref 3.5–5.0)
Anion gap: 11 (ref 5–15)
BUN: 16 mg/dL (ref 6–20)
CALCIUM: 9.8 mg/dL (ref 8.9–10.3)
CHLORIDE: 102 mmol/L (ref 101–111)
CO2: 26 mmol/L (ref 22–32)
Creatinine, Ser: 0.91 mg/dL (ref 0.61–1.24)
GFR calc Af Amer: >60 mL/min (ref >60)
GFR calc non Af Amer: >60 mL/min (ref >60)
GLUCOSE: 128 mg/dL — AB (ref 65–99)
Potassium: 3.9 mmol/L (ref 3.5–5.1)
SODIUM: 139 mmol/L (ref 135–145)
Total Bilirubin: 0.9 mg/dL (ref 0.3–1.2)
Total Protein: 7.9 g/dL (ref 6.5–8.1)

## 2017-12-12 LAB — CBC
HCT: 36.7 % — ABNORMAL LOW (ref 39.0–52.0)
HEMOGLOBIN: 11.5 g/dL — AB (ref 13.0–17.0)
MCH: 28 pg (ref 26.0–34.0)
MCHC: 31.3 g/dL (ref 30.0–36.0)
MCV: 89.5 fL (ref 78.0–100.0)
Platelets: 284 10*3/uL (ref 150–400)
RBC: 4.1 MIL/uL — ABNORMAL LOW (ref 4.22–5.81)
RDW: 14.8 % (ref 11.5–15.5)
WBC: 6.5 10*3/uL (ref 4.0–10.5)

## 2017-12-12 LAB — I-STAT CG4 LACTIC ACID, ED: LACTIC ACID, VENOUS: 1.66 mmol/L (ref 0.5–1.9)

## 2017-12-12 LAB — LIPASE, BLOOD: LIPASE: 21 U/L (ref 11–51)

## 2017-12-12 LAB — GLUCOSE, CAPILLARY: GLUCOSE-CAPILLARY: 128 mg/dL — AB (ref 65–99)

## 2017-12-12 SURGERY — REPAIR, HERNIA, UMBILICAL, LAPAROSCOPIC
Anesthesia: General

## 2017-12-12 MED ORDER — HEPARIN SODIUM (PORCINE) 5000 UNIT/ML IJ SOLN
5000.0000 [IU] | Freq: Three times a day (TID) | INTRAMUSCULAR | Status: DC
  Administered 2017-12-13 – 2017-12-14 (×3): 5000 [IU] via SUBCUTANEOUS
  Filled 2017-12-12 (×4): qty 1

## 2017-12-12 MED ORDER — SODIUM CHLORIDE 0.9 % IV BOLUS
1000.0000 mL | Freq: Once | INTRAVENOUS | Status: AC
  Administered 2017-12-12: 1000 mL via INTRAVENOUS

## 2017-12-12 MED ORDER — POTASSIUM CHLORIDE IN NACL 20-0.45 MEQ/L-% IV SOLN
INTRAVENOUS | Status: DC
  Administered 2017-12-13: 01:00:00 via INTRAVENOUS
  Administered 2017-12-13: 1000 mL via INTRAVENOUS
  Filled 2017-12-12 (×3): qty 1000

## 2017-12-12 MED ORDER — PHENYLEPHRINE HCL 10 MG/ML IJ SOLN
INTRAMUSCULAR | Status: DC | PRN
  Administered 2017-12-12: 80 ug via INTRAVENOUS

## 2017-12-12 MED ORDER — DEXAMETHASONE SODIUM PHOSPHATE 10 MG/ML IJ SOLN
INTRAMUSCULAR | Status: AC
  Filled 2017-12-12: qty 1

## 2017-12-12 MED ORDER — LIDOCAINE 2% (20 MG/ML) 5 ML SYRINGE
INTRAMUSCULAR | Status: DC | PRN
  Administered 2017-12-12: 100 mg via INTRAVENOUS

## 2017-12-12 MED ORDER — GABAPENTIN 300 MG PO CAPS
300.0000 mg | ORAL_CAPSULE | Freq: Two times a day (BID) | ORAL | Status: DC
  Administered 2017-12-12 – 2017-12-13 (×3): 300 mg via ORAL
  Filled 2017-12-12 (×3): qty 1

## 2017-12-12 MED ORDER — MIDAZOLAM HCL 2 MG/2ML IJ SOLN
INTRAMUSCULAR | Status: AC
  Filled 2017-12-12: qty 2

## 2017-12-12 MED ORDER — BUPIVACAINE-EPINEPHRINE 0.5% -1:200000 IJ SOLN
INTRAMUSCULAR | Status: AC
  Filled 2017-12-12: qty 1

## 2017-12-12 MED ORDER — MEPERIDINE HCL 50 MG/ML IJ SOLN: 6.2500 mg | INTRAMUSCULAR | Status: DC | PRN

## 2017-12-12 MED ORDER — PROPOFOL 10 MG/ML IV BOLUS
INTRAVENOUS | Status: AC
  Filled 2017-12-12: qty 20

## 2017-12-12 MED ORDER — INSULIN ASPART 100 UNIT/ML ~~LOC~~ SOLN
0.0000 [IU] | Freq: Three times a day (TID) | SUBCUTANEOUS | Status: DC
  Administered 2017-12-13: 11 [IU] via SUBCUTANEOUS
  Administered 2017-12-13: 7 [IU] via SUBCUTANEOUS

## 2017-12-12 MED ORDER — MORPHINE SULFATE (PF) 2 MG/ML IV SOLN: 1.0000 mg | INTRAVENOUS | Status: DC | PRN

## 2017-12-12 MED ORDER — MORPHINE SULFATE (PF) 4 MG/ML IV SOLN
4.0000 mg | Freq: Once | INTRAVENOUS | Status: AC
  Administered 2017-12-12: 4 mg via INTRAVENOUS
  Filled 2017-12-12: qty 1

## 2017-12-12 MED ORDER — EPHEDRINE SULFATE 50 MG/ML IJ SOLN
INTRAMUSCULAR | Status: DC | PRN
  Administered 2017-12-12: 10 mg via INTRAVENOUS

## 2017-12-12 MED ORDER — MIDAZOLAM HCL 5 MG/5ML IJ SOLN
INTRAMUSCULAR | Status: DC | PRN
  Administered 2017-12-12: 2 mg via INTRAVENOUS

## 2017-12-12 MED ORDER — LIDOCAINE 2% (20 MG/ML) 5 ML SYRINGE
INTRAMUSCULAR | Status: AC
  Filled 2017-12-12: qty 5

## 2017-12-12 MED ORDER — PROPOFOL 10 MG/ML IV BOLUS
INTRAVENOUS | Status: DC | PRN
  Administered 2017-12-12: 200 mg via INTRAVENOUS

## 2017-12-12 MED ORDER — BUPIVACAINE-EPINEPHRINE 0.5% -1:200000 IJ SOLN
INTRAMUSCULAR | Status: DC | PRN
  Administered 2017-12-12: 50 mL

## 2017-12-12 MED ORDER — LACTATED RINGERS IR SOLN
Status: DC | PRN
  Administered 2017-12-12: 1000 mL

## 2017-12-12 MED ORDER — ACETAMINOPHEN 325 MG PO TABS
650.0000 mg | ORAL_TABLET | Freq: Three times a day (TID) | ORAL | Status: DC
  Administered 2017-12-12 – 2017-12-14 (×5): 650 mg via ORAL
  Filled 2017-12-12 (×6): qty 2

## 2017-12-12 MED ORDER — ONDANSETRON HCL 4 MG/2ML IJ SOLN
INTRAMUSCULAR | Status: DC | PRN
  Administered 2017-12-12: 4 mg via INTRAVENOUS

## 2017-12-12 MED ORDER — DEXAMETHASONE SODIUM PHOSPHATE 10 MG/ML IJ SOLN
INTRAMUSCULAR | Status: DC | PRN
  Administered 2017-12-12: 10 mg via INTRAVENOUS

## 2017-12-12 MED ORDER — HYDROMORPHONE HCL 1 MG/ML IJ SOLN: 0.2500 mg | INTRAMUSCULAR | Status: DC | PRN

## 2017-12-12 MED ORDER — SODIUM CHLORIDE 0.9 % IR SOLN
Status: DC | PRN
  Administered 2017-12-12: 1000 mL

## 2017-12-12 MED ORDER — SUGAMMADEX SODIUM 500 MG/5ML IV SOLN
INTRAVENOUS | Status: DC | PRN
  Administered 2017-12-12: 500 mg via INTRAVENOUS

## 2017-12-12 MED ORDER — ROCURONIUM BROMIDE 50 MG/5ML IV SOSY
PREFILLED_SYRINGE | INTRAVENOUS | Status: DC | PRN
  Administered 2017-12-12: 30 mg via INTRAVENOUS
  Administered 2017-12-12: 60 mg via INTRAVENOUS

## 2017-12-12 MED ORDER — DEXTROSE 5 % IV SOLN
2.0000 g | Freq: Once | INTRAVENOUS | Status: AC
  Administered 2017-12-12: 2 g via INTRAVENOUS
  Filled 2017-12-12: qty 2

## 2017-12-12 MED ORDER — MIDAZOLAM HCL 2 MG/2ML IJ SOLN: 0.5000 mg | Freq: Once | INTRAMUSCULAR | Status: DC | PRN

## 2017-12-12 MED ORDER — FENTANYL CITRATE (PF) 250 MCG/5ML IJ SOLN
INTRAMUSCULAR | Status: AC
  Filled 2017-12-12: qty 5

## 2017-12-12 MED ORDER — ROCURONIUM BROMIDE 10 MG/ML (PF) SYRINGE
PREFILLED_SYRINGE | INTRAVENOUS | Status: AC
  Filled 2017-12-12: qty 5

## 2017-12-12 MED ORDER — PROMETHAZINE HCL 25 MG/ML IJ SOLN: 6.2500 mg | INTRAMUSCULAR | Status: DC | PRN

## 2017-12-12 MED ORDER — ONDANSETRON 4 MG PO TBDP: 4.0000 mg | ORAL_TABLET | Freq: Four times a day (QID) | ORAL | Status: DC | PRN

## 2017-12-12 MED ORDER — LACTATED RINGERS IV SOLN
INTRAVENOUS | Status: DC | PRN
  Administered 2017-12-12: 19:00:00 via INTRAVENOUS

## 2017-12-12 MED ORDER — TRAMADOL HCL 50 MG PO TABS: 50.0000 mg | ORAL_TABLET | Freq: Four times a day (QID) | ORAL | Status: DC | PRN

## 2017-12-12 MED ORDER — HYDROCODONE-ACETAMINOPHEN 5-325 MG PO TABS: 1.0000 | ORAL_TABLET | ORAL | Status: DC | PRN

## 2017-12-12 MED ORDER — FENTANYL CITRATE (PF) 100 MCG/2ML IJ SOLN
INTRAMUSCULAR | Status: DC | PRN
  Administered 2017-12-12: 50 ug via INTRAVENOUS
  Administered 2017-12-12: 25 ug via INTRAVENOUS

## 2017-12-12 MED ORDER — ONDANSETRON HCL 4 MG/2ML IJ SOLN
INTRAMUSCULAR | Status: AC
  Filled 2017-12-12: qty 2

## 2017-12-12 MED ORDER — SUCCINYLCHOLINE CHLORIDE 200 MG/10ML IV SOSY
PREFILLED_SYRINGE | INTRAVENOUS | Status: DC | PRN
  Administered 2017-12-12: 160 mg via INTRAVENOUS

## 2017-12-12 MED ORDER — ONDANSETRON HCL 4 MG/2ML IJ SOLN: 4.0000 mg | Freq: Four times a day (QID) | INTRAMUSCULAR | Status: DC | PRN

## 2017-12-12 SURGICAL SUPPLY — 41 items
APPLIER CLIP 5 13 M/L LIGAMAX5 (MISCELLANEOUS)
APPLIER CLIP ROT 10 11.4 M/L (STAPLE)
BINDER ABDOMINAL 12 ML 46-62 (SOFTGOODS) ×2 IMPLANT
CABLE HIGH FREQUENCY MONO STRZ (ELECTRODE) ×2 IMPLANT
CHLORAPREP W/TINT 26ML (MISCELLANEOUS) ×2 IMPLANT
CLIP APPLIE 5 13 M/L LIGAMAX5 (MISCELLANEOUS) IMPLANT
CLIP APPLIE ROT 10 11.4 M/L (STAPLE) IMPLANT
COVER SURGICAL LIGHT HANDLE (MISCELLANEOUS) ×2 IMPLANT
DECANTER SPIKE VIAL GLASS SM (MISCELLANEOUS) ×2 IMPLANT
DERMABOND ADVANCED (GAUZE/BANDAGES/DRESSINGS) ×1
DERMABOND ADVANCED .7 DNX12 (GAUZE/BANDAGES/DRESSINGS) ×1 IMPLANT
DEVICE SECURE STRAP 25 ABSORB (INSTRUMENTS) IMPLANT
DEVICE TROCAR PUNCTURE CLOSURE (ENDOMECHANICALS) ×2 IMPLANT
DRAPE INCISE IOBAN 66X45 STRL (DRAPES) ×4 IMPLANT
ELECT REM PT RETURN 15FT ADLT (MISCELLANEOUS) ×2 IMPLANT
GAUZE SPONGE 4X4 12PLY STRL (GAUZE/BANDAGES/DRESSINGS) ×2 IMPLANT
GLOVE SURG SIGNA 7.5 PF LTX (GLOVE) ×2 IMPLANT
GOWN STRL REUS W/TWL XL LVL3 (GOWN DISPOSABLE) ×6 IMPLANT
IRRIG SUCT STRYKERFLOW 2 WTIP (MISCELLANEOUS)
IRRIGATION SUCT STRKRFLW 2 WTP (MISCELLANEOUS) IMPLANT
KIT BASIN OR (CUSTOM PROCEDURE TRAY) ×2 IMPLANT
NEEDLE SPNL 22GX3.5 QUINCKE BK (NEEDLE) ×2 IMPLANT
PAD POSITIONING PINK XL (MISCELLANEOUS) IMPLANT
POSITIONER SURGICAL ARM (MISCELLANEOUS) IMPLANT
SCISSORS LAP 5X35 DISP (ENDOMECHANICALS) ×2 IMPLANT
SHEARS HARMONIC ACE PLUS 36CM (ENDOMECHANICALS) IMPLANT
SLEEVE XCEL OPT CAN 5 100 (ENDOMECHANICALS) ×2 IMPLANT
STRIP CLOSURE SKIN 1/2X4 (GAUZE/BANDAGES/DRESSINGS) IMPLANT
SUT MNCRL AB 4-0 PS2 18 (SUTURE) ×2 IMPLANT
SUT NOVA NAB GS-21 0 18 T12 DT (SUTURE) ×4 IMPLANT
SUT NOVA T20/GS 25 (SUTURE) IMPLANT
SUT VIC AB 3-0 SH 18 (SUTURE) ×2 IMPLANT
TAPE CLOTH 4X10 WHT NS (GAUZE/BANDAGES/DRESSINGS) IMPLANT
TAPE CLOTH SURG 4X10 WHT LF (GAUZE/BANDAGES/DRESSINGS) ×2 IMPLANT
TOWEL OR 17X26 10 PK STRL BLUE (TOWEL DISPOSABLE) ×2 IMPLANT
TOWEL OR NON WOVEN STRL DISP B (DISPOSABLE) ×2 IMPLANT
TRAY FOLEY W/METER SILVER 16FR (SET/KITS/TRAYS/PACK) IMPLANT
TRAY LAPAROSCOPIC (CUSTOM PROCEDURE TRAY) ×2 IMPLANT
TROCAR BLADELESS OPT 5 100 (ENDOMECHANICALS) ×2 IMPLANT
TROCAR XCEL NON-BLD 11X100MML (ENDOMECHANICALS) ×2 IMPLANT
TUBING INSUF HEATED (TUBING) IMPLANT

## 2017-12-12 NOTE — ED Notes (Signed)
Called OR and asked for OR to come to ED to transport patient to OR room due to short staff and change of shift. OR RN stated she is not able to and that ED will need to transport.

## 2017-12-12 NOTE — Anesthesia Preprocedure Evaluation (Addendum)
Anesthesia Evaluation  Patient identified by MRN, date of birth, ID band Patient awake    Reviewed: Allergy & Precautions, NPO status , Patient's Chart, lab work & pertinent test results  History of Anesthesia Complications Negative for: history of anesthetic complications  Airway Mallampati: II  TM Distance: >3 FB Neck ROM: Full    Dental  (+) Loose, Poor Dentition, Chipped, Dental Advisory Given, Missing   Pulmonary sleep apnea (does not use CPAP) ,    breath sounds clear to auscultation       Cardiovascular negative cardio ROS   Rhythm:Regular Rate:Normal  '15 ECHO: EF 50-55%, valves OK   Neuro/Psych negative neurological ROS     GI/Hepatic Neg liver ROS, Nausea and vomiting with incarcerated hernia   Endo/Other  diabetes (glu 128, doesn't take meds regularly), Oral Hypoglycemic AgentsMorbid obesity  Renal/GU negative Renal ROS     Musculoskeletal   Abdominal (+) + obese,   Peds  Hematology   Anesthesia Other Findings   Reproductive/Obstetrics                            Anesthesia Physical Anesthesia Plan  ASA: III and emergent  Anesthesia Plan: General   Post-op Pain Management:    Induction: Intravenous, Rapid sequence and Cricoid pressure planned  PONV Risk Score and Plan: 3 and Ondansetron and Dexamethasone  Airway Management Planned: Oral ETT  Additional Equipment:   Intra-op Plan:   Post-operative Plan: Extubation in OR  Informed Consent: I have reviewed the patients History and Physical, chart, labs and discussed the procedure including the risks, benefits and alternatives for the proposed anesthesia with the patient or authorized representative who has indicated his/her understanding and acceptance.   Dental advisory given  Plan Discussed with: CRNA and Surgeon  Anesthesia Plan Comments: (Plan routine monitors, GETA )        Anesthesia Quick  Evaluation

## 2017-12-12 NOTE — Op Note (Signed)
OPERATIVE NOTE  12/12/2017  8:35 PM  PATIENT:  Keith Larsen, 57 y.o., male, MRN: [id number]  PREOP DIAGNOSIS:  incarcerated umbilical hernia  POSTOP DIAGNOSIS:   Incarcerated umbilical hernia  PROCEDURE:   Procedure(s): LAPAROSCOPIC exploration and open UMBILICAL HERNIA  Repair  SURGEON:   Ovidio Kinavid Kyliee Ortego, M.D.  ASSISTANT:   None  ANESTHESIA:   general  Anesthesiologist: Jairo BenJackson, [place], MD CRNA: Wynonia SoursWalker, Karen L, CRNA  General  EBL:  minimal  ml  BLOOD ADMINISTERED: none  DRAINS: none   LOCAL MEDICATIONS USED:   30 cc 1/4% marcaine  SPECIMEN:   Hernia sac  COUNTS CORRECT:  YES  INDICATIONS FOR PROCEDURE:  Keith Larsen is a 57 y.o. (DOB: 01/19/1961) AA male whose primary care physician is Long, [employer], PA-C and comes for repair of an incarcerated umbilical hernia.   The indications and risks of the surgery were explained to the patient.  The risks include, but are not limited to, infection, bleeding, and nerve injury.  OPERATIVE NOTE:  The patient [facility]  He underwent a general anesthesia.  He was given Cefotetan prior to the beginning of the operation.   A time out was held and the surgical checklist run.   The abdomen was prepped with Cholroprep and sterilely draped.   And the abdomen was covered with Ioban drape.   He had a 4 cm protruding mass at the umbilicus.  I was not able to reduce the mass once asleep.  I placed a 5 mm Ethicon optiview trocar in the LUQ of the abdomen.  I placed a second 5 mm Ethicon trocar in the left lateral abdomen. I carried out an abdominal exploration.  The liver was unremarkable.  The gall bladder was robin's egg blue.  The stomach was normal.   He had a loop of small bowel incarcerated in the umbilical hernia.   I was able to reduce the small bowel.  He had about a 7 cm length of small bowel in the hernia.   Though hemorrhagic, the bowel looked viable.   I then carried out an open repair.  I made a  infraumbilical "smiling" incision and cut down to the rectus fascia.  He had a 3.0 cm defect at the umbilicus.  I resected the hernia sac and sewed the peritoneum with 3-0 vicryl sutures.   I closed the defect with 8  interrupted 0 Novafil.    I then relaparoscoped the patient.  The bowel, which was hemorrhagic, looked better.  There was peristalsis that went through the beat up portion of the small bowel.    I then tacked the umbilical skin down to the fascia with 3-0 vicryl.  I used 3-0 vicryl for the subcutaneous layer.  The incision was closed with 4-0 Monocryl and painted the skin with Dermabond.  An abdominal binder was placed.   The sponge and needle count were correct.  The patient was transferred to the recovery room in good condition.   Type of repair - primary suture  (choices - primary suture, mesh, or component)  Name of mesh - None   Ovidio Kinavid Alek Borges, MD, [facility] Pager: [phone] Office phone:  [phone]

## 2017-12-12 NOTE — ED Notes (Signed)
Pt aware urine sample is needed, states that he needs fluids.

## 2017-12-12 NOTE — ED Triage Notes (Signed)
Pt is presenting with c/o abdominal pain, reports that he was seen at urgent care abd advised to come in to r/o strangulated hernia. Reports 10/10, last BM 24hrs ago.

## 2017-12-12 NOTE — H&P (Signed)
Re:   Keith Larsen DOB:   [birth year] MRN:   [id number]  Chief Complaint Abdominal pain  ASSESEMENT AND PLAN: 1.  Incarcerated umbilcal hernia  I discussed the indications and complications of hernia surgery with the patient.  I discussed both the laparoscopic and open approach to hernia repair..  The potential risks of hernia surgery include, but are not limited to, bleeding, infection, open surgery, nerve injury, and recurrence of the hernia.    2. DM  His management is through the [facility] 3. Obese 4.  History of syncope - 2015 - cause unclear   Chief Complaint  Patient presents with  . Abdominal Pain  . Sent from UC   PHYSICIAN REQUESTING CONSULTATION:   Lyndel Safe, [place], [facility] HISTORY OF PRESENT ILLNESS: Keith Larsen is a 57 y.o. (DOB: 05/27/1961)  AA male whose primary care physician is Long, [place], [place].  His wife, Drinda Butts, is at his bedside.    The patient's had an umbilical hernia for a couple of years. The hernia usually pops in and out and he can reduce it easily. Yesterday, he was leaning over a car that he was working on an developed pain in his umbilicus.  He felt dizzy and felt that he may pass out.  He tried to sleep through the night, but did not get much sleep. He had continued abdominal pain and vomited at least 5 times.  He then went to [facility] and they sent him to the [facility].  He has no prior history of peptic ulcer disease, liver disease, pancreas disease or colon disease. He's had no prior abdominal surgery. He had a colonoscopy about 2 years ago done by Dr. Jeani Hawking (he thinks).  He is obese, but his weight has been stable.    Past Medical History:  Diagnosis Date  . Diabetes mellitus without complication (HCC)      History reviewed. No pertinent surgical history.    Current Facility-Administered Medications  Medication Dose Route Frequency Provider Last Rate Last Dose  . cefOXitin (MEFOXIN) 2 g in  dextrose 5 % 50 mL IVPB  2 g Intravenous Once Ovidio Kin, MD      . sodium chloride 0.9 % bolus 1,000 mL  1,000 mL Intravenous Once Cristina Gong, [place] 983.6 mL/hr at 12/12/17 1826 1,000 mL at 12/12/17 1826   Current Outpatient Medications  Medication Sig Dispense Refill  . glyBURIDE-metformin (GLUCOVANCE) 5-500 MG per tablet Take 1 tablet by mouth 2 (two) times daily with a meal.    . METFORMIN HCL PO Take by mouth.    . Multiple Vitamin (MULTIVITAMIN WITH MINERALS) TABS tablet Take 1 tablet by mouth daily.       No Known Allergies  REVIEW OF SYSTEMS: Skin:  No history of rash.  No history of abnormal moles. Infection:  No history of hepatitis or HIV.  No history of MRSA. Neurologic:  History of syncope 2015.  Cause never identified. He has had no further problems. Cardiac:  No history of hypertension. No history of heart disease.  No history of prior cardiac catheterization.  No history of seeing a cardiologist. Pulmonary:  Does not smoke cigarettes.  No asthma or bronchitis.  No OSA/CPAP.  Endocrine:  DM - followed by the physicians at [facility]. Gastrointestinal:  See HPI Urologic:  No history of kidney stones.  No history of bladder infections. Musculoskeletal:  No history of joint or back disease. Hematologic:  No  bleeding disorder.  No history of anemia.  Not anticoagulated. Psycho-social:  The patient is oriented.   The patient has no obvious psychologic or social impairment to understanding our conversation and plan.  SOCIAL and FAMILY HISTORY: Married.  Wife, Drinda Butts, is at the bedside. He has two children:  16 yo daughter, 91 yo son, [place]. He works for [employer].  No family history of hernia.  He does not smoke.  PHYSICAL EXAM: BP (!) 145/91   Pulse 88   Temp 99.3 F (37.4 C) (Oral)   Resp 19   Ht 6' (1.829 m)   Wt 109.3 kg (241 lb)   SpO2 95%   BMI 32.69 kg/m   General: Obese AA M who is alert and generally healthy appearing.    Skin:  Inspection and palpation - no mass or rash. Eyes:  Conjunctiva and lids unremarkable.            Pupils are equal Ears, Nose, Mouth, and Throat:  Ears and nose unremarkable            Lips and teeth are unremarable. Neck: Supple. No mass, trachea midline.  No thyroid mass. Lymph Nodes:  No supraclavicular, cervical, or inguinal nodes. Lungs: Normal respiratory effort.  Clear to auscultation and symmetric breath sounds. Heart:  Palpation of the heart is normal.            Auscultation: RRR. No murmur or rub.  Abdomen: Soft. Quiet abdomen. No abdominal scars.  He has an incarcerated umbilical hernia Rectal: Not done. Musculoskeletal:  Good muscle strength and ROM  in upper and lower extremities.  Neurologic:  Grossly intact to motor and sensory function. Psychiatric: Normal judgement and insight. Behavior is normal.            Oriented to time, person, place.   DATA REVIEWED, COUNSELING AND COORDINATION OF CARE: Epic notes reviewed. Counseling and coordination of care exceeded more than 50% of the time spent with patient. Total time spent with patient and charting: 45 minutes.  Ovidio Kin, MD,  [facility] [address]  [address]   [place], [place]    [address] Phone:  [phone] FAX:  [phone]

## 2017-12-12 NOTE — ED Notes (Signed)
Pt consent for Surgery is at bedside. NT is with patient in room getting patient ready for pre-op.

## 2017-12-12 NOTE — Anesthesia Postprocedure Evaluation (Signed)
Anesthesia Post Note  Patient: Keith Larsen  Procedure(s) Performed: LAPAROSCOPIC UMBILICAL HERNIA  Repair (N/A )     Patient location during evaluation: PACU Anesthesia Type: General Level of consciousness: awake and alert, oriented and patient cooperative Pain management: pain level controlled Vital Signs Assessment: post-procedure vital signs reviewed and stable Respiratory status: spontaneous breathing, nonlabored ventilation, respiratory function stable and patient connected to nasal cannula oxygen Cardiovascular status: blood pressure returned to baseline and stable Postop Assessment: no apparent nausea or vomiting Anesthetic complications: no    Last Vitals:  Vitals:   12/12/17 2130 12/12/17 2147  BP: 122/81 136/83  Pulse:  90  Resp:  18  Temp: 36.9 C 37.2 C  SpO2:  95%    Last Pain:  Vitals:   12/12/17 2130  TempSrc:   PainSc: 0-No pain                 Deborrah Mabin,E. Raylan Hanton

## 2017-12-12 NOTE — Transfer of Care (Signed)
Immediate Anesthesia Transfer of Care Note  Patient: Keith Larsen J Grieves  Procedure(s) Performed: LAPAROSCOPIC UMBILICAL HERNIA  Repair (N/A )  Patient Location: PACU  Anesthesia Type:General  Level of Consciousness: awake, alert , oriented and patient cooperative  Airway & Oxygen Therapy: Patient Spontanous Breathing and Patient connected to face mask oxygen  Post-op Assessment: Report given to RN and Post -op Vital signs reviewed and stable  Post vital signs: Reviewed and stable  Last Vitals:  Vitals Value Taken Time  BP 136/70 12/12/2017  8:54 PM  Temp    Pulse 93 12/12/2017  8:58 PM  Resp 17 12/12/2017  8:58 PM  SpO2 100 % 12/12/2017  8:58 PM  Vitals shown include unvalidated device data.  Last Pain:  Vitals:   12/12/17 1848  TempSrc:   PainSc: 7          Complications: No apparent anesthesia complications

## 2017-12-12 NOTE — ED Notes (Signed)
Bed: WA21 Expected date:  Expected time:  Means of arrival:  Comments: Held. Critical patient in section

## 2017-12-12 NOTE — Anesthesia Procedure Notes (Signed)
Procedure Name: Intubation Date/Time: 12/12/2017 7:31 PM Performed by: West Pugh, CRNA Pre-anesthesia Checklist: Patient identified, Emergency Drugs available, Suction available, Patient being monitored and Timeout performed Patient Re-evaluated:Patient Re-evaluated prior to induction Oxygen Delivery Method: Circle system utilized Preoxygenation: Pre-oxygenation with 100% oxygen Induction Type: IV induction, Rapid sequence and Cricoid Pressure applied Laryngoscope Size: Mac and 4 Grade View: Grade I Tube type: Oral Tube size: 7.5 mm Number of attempts: 1 Airway Equipment and Method: Stylet Placement Confirmation: ETT inserted through vocal cords under direct vision,  positive ETCO2,  CO2 detector and breath sounds checked- equal and bilateral Secured at: 24 cm Tube secured with: Tape Dental Injury: Teeth and Oropharynx as per pre-operative assessment  Comments: Preop assessment reveals multiple loose bottom teeth. AOI x 1 attempt.

## 2017-12-12 NOTE — ED Provider Notes (Signed)
[facility]-EMERGENCY DEPT Provider Note   CSN: [id number] Arrival date & time: 12/12/17  1427     History   Chief Complaint Chief Complaint  Patient presents with  . Abdominal Pain  . Sent from UC    HPI Keith Larsen is a 57 y.o. male with a history of DM who presents today for evaluation of abdominal pain that began yesterday.  He reports that he normally has a hernia around his bellybutton but pops in and out, however last night he was leaning on his car while working on it and since then has had increasing pain in that area.  He reports that his pain is currently 10 out of 10 he is nauseous and has multiple episodes of vomiting.  He is a smoker bowel movement was approximately 24 hours ago.  He has never had hernia surgery before or any abdominal surgeries.   HPI  Past Medical History:  Diagnosis Date  . Diabetes mellitus without complication [facility](HCC)     Patient Active Problem List   Diagnosis Date Noted  . Syncope 08/05/2014  . Hypoxia 08/05/2014  . Type 2 diabetes mellitus (HCC) 08/05/2014    History reviewed. No pertinent surgical history.      Home Medications    Prior to Admission medications   Medication Sig Start Date End Date Taking? Authorizing Provider  glyBURIDE-metformin (GLUCOVANCE) 5-500 MG per tablet Take 1 tablet by mouth 2 (two) times daily with a meal.    [provider]  METFORMIN HCL PO Take by mouth.    [provider]  Multiple Vitamin (MULTIVITAMIN WITH MINERALS) TABS tablet Take 1 tablet by mouth daily.    [provider]    Family History No family history on file.  Social History Social History   Tobacco Use  . Smoking status: Never Smoker  Substance Use Topics  . Alcohol use: Yes    Comment: occasionally  . Drug use: Not on file     Allergies   Patient has no known allergies.   Review of Systems Review of Systems  Constitutional: Positive for chills. Negative for  fatigue and fever.  HENT: Negative for congestion and dental problem.   Respiratory: Negative for cough and shortness of breath.   Cardiovascular: Negative for chest pain and palpitations.  Gastrointestinal: Positive for abdominal pain, constipation, diarrhea, nausea and vomiting.  Genitourinary: Negative for hematuria.  Musculoskeletal: Negative for back pain and neck pain.  Skin: Positive for color change (over belly button).  Neurological: Negative for weakness and headaches.  All other systems reviewed and are negative.    Physical Exam Updated Vital Signs BP 134/90 (BP Location: Right Arm)   Pulse 85   Temp 99.3 F (37.4 C) (Oral)   Resp 17   Ht 6' (1.829 m)   Wt 109.3 kg (241 lb)   SpO2 97%   BMI 32.69 kg/m   Physical Exam  Constitutional: He appears well-developed and well-nourished.  Non-toxic appearance. No distress.  HENT:  Head: Normocephalic and atraumatic.  Eyes: Conjunctivae are normal. Right eye exhibits no discharge. Left eye exhibits no discharge. No scleral icterus.  Neck: Normal range of motion.  Cardiovascular: Normal rate and regular rhythm.  Pulmonary/Chest: Effort normal. No stridor. No respiratory distress.  Abdominal: Bowel sounds are normal. He exhibits no distension. There is generalized tenderness. There is no guarding and no CVA tenderness. A hernia (Umbilical, not reducable, TTP, red over the hernia) is present.  Musculoskeletal: He exhibits no  edema or deformity.  Neurological: He is alert. He exhibits normal muscle tone.  Skin: Skin is warm and dry. He is not diaphoretic.  Psychiatric: He has a normal mood and affect. His behavior is normal.  Nursing note and vitals reviewed.    ED Treatments / Results  Labs (all labs ordered are listed, but only abnormal results are displayed) Labs Reviewed  COMPREHENSIVE METABOLIC PANEL - Abnormal; Notable for the following components:      Result Value   Glucose, Bld 128 (*)    All other components  within normal limits  CBC - Abnormal; Notable for the following components:   RBC 4.10 (*)    Hemoglobin 11.5 (*)    HCT 36.7 (*)    All other components within normal limits  GLUCOSE, CAPILLARY - Abnormal; Notable for the following components:   Glucose-Capillary 128 (*)    All other components within normal limits  LIPASE, BLOOD  CBC  BASIC METABOLIC PANEL  I-STAT CG4 LACTIC ACID, ED  SURGICAL PATHOLOGY    EKG None  Radiology No results found.  Procedures Procedures (including critical care time)  Medications Ordered in ED Medications  sodium chloride 0.9 % bolus 1,000 mL (has no administration in time range)  morphine 4 MG/ML injection 4 mg (has no administration in time range)     Initial Impression / Assessment and Plan / ED Course  I have reviewed the triage vital signs and the nursing notes.  Pertinent labs & imaging results that were available during my care of the patient were reviewed by me and considered in my medical decision making (see chart for details).  Clinical Course as of Dec 13 41  Wynelle Link Dec 12, 2017  1819 Spoke with surgery who will come evaluate patient.    [EH]    Clinical Course User Index [EH] Cristina Gong, PA-C   Patient presents today for evaluation of abdominal pain with an area of redness and swelling above his bellybutton.  I attempted to reduce the hernia at bedside using steady gentle pressure and Trendelenburg position without success.  Labs were obtained and reviewed.  Acute abdominal series was ordered which did not show any obstruction.  I spoke with the surgeon on call who came to evaluate the patient and took him to the operating room for repair.  His pain was treated in the ED with morphine and he was given a fluid bolus.  He did not meet sirs or sepsis criteria.     Final Clinical Impressions(s) / ED Diagnoses   Final diagnoses:  Irreducible umbilical hernia    ED Discharge Orders    None       Norman Clay 12/13/17 0046    Phillis Haggis, MD 12/13/17 2028

## 2017-12-12 NOTE — ED Notes (Signed)
Bed: [id number] Expected date:  Expected time:  Means of arrival:  Comments: Hold for now

## 2017-12-13 ENCOUNTER — Encounter (HOSPITAL_COMMUNITY): Payer: Self-pay | Admitting: Surgery

## 2017-12-13 LAB — CBC
HCT: 31.5 % — ABNORMAL LOW (ref 39.0–52.0)
Hemoglobin: 10 g/dL — ABNORMAL LOW (ref 13.0–17.0)
MCH: 28.4 pg (ref 26.0–34.0)
MCHC: 31.7 g/dL (ref 30.0–36.0)
MCV: 89.5 fL (ref 78.0–100.0)
PLATELETS: 232 10*3/uL (ref 150–400)
RBC: 3.52 MIL/uL — ABNORMAL LOW (ref 4.22–5.81)
RDW: 14.7 % (ref 11.5–15.5)
WBC: 6.5 10*3/uL (ref 4.0–10.5)

## 2017-12-13 LAB — GLUCOSE, CAPILLARY
GLUCOSE-CAPILLARY: 259 mg/dL — AB (ref 65–99)
GLUCOSE-CAPILLARY: 69 mg/dL (ref 65–99)
GLUCOSE-CAPILLARY: 95 mg/dL (ref 65–99)
GLUCOSE-CAPILLARY: 213 mg/dL — AB (ref 65–99)
Glucose-Capillary: 215 mg/dL — ABNORMAL HIGH (ref 65–99)

## 2017-12-13 LAB — BASIC METABOLIC PANEL
Anion gap: 7 (ref 5–15)
BUN: 18 mg/dL (ref 6–20)
CALCIUM: 9.3 mg/dL (ref 8.9–10.3)
CO2: 28 mmol/L (ref 22–32)
Chloride: 101 mmol/L (ref 101–111)
Creatinine, Ser: 1.04 mg/dL (ref 0.61–1.24)
GFR calc Af Amer: >60 mL/min (ref >60)
GLUCOSE: 226 mg/dL — AB (ref 65–99)
Potassium: 4.8 mmol/L (ref 3.5–5.1)
Sodium: 136 mmol/L (ref 135–145)

## 2017-12-13 NOTE — Progress Notes (Signed)
Chaplain provided support around advance directive due to spiritual care consult.    Provided education around health care power of attorned and living will.   Mr. Amada JupiterKirkpatrick stated he wants a will for material possessions.  Understands we cannot provide this service.   [facility] Chaplain Burnis KingfisherMatthew Caralyn Twining, MDiv, [facility]

## 2017-12-13 NOTE — Progress Notes (Signed)
1 Day Post-Op   Subjective/Chief Complaint: He feels much better this morning Pain is much less No nausea but has taken little po.  Waiting on breakfast to come up.   Objective: Vital signs in last 24 hours: Temp:  [97.5 F (36.4 C)-99.3 F (37.4 C)] 97.7 F (36.5 C) (04/01 0515) Pulse Rate:  [74-91] 74 (04/01 0515) Resp:  [14-19] 16 (04/01 0515) BP: (121-145)/(81-93) 121/82 (04/01 0515) SpO2:  [95 %-99 %] 99 % (04/01 0515) Weight:  [109.3 kg (241 lb)] 109.3 kg (241 lb) (03/31 1528)    Intake/Output from previous day: 03/31 0701 - 04/01 0700 In: 1580 [P.O.:240; I.V.:900] Out: 700 [Urine:700] Intake/Output this shift: No intake/output data recorded.  Exam: Looks comfortable Awake and alert Abdomen soft, obese, incisions clean  Lab Results:  Recent Labs    12/12/17 1600 12/13/17 0459  WBC 6.5 6.5  HGB 11.5* 10.0*  HCT 36.7* 31.5*  PLT 284 232   BMET Recent Labs    12/12/17 1600 12/13/17 0459  NA 139 136  K 3.9 4.8  CL 102 101  CO2 26 28  GLUCOSE 128* 226*  BUN 16 18  CREATININE 0.91 1.04  CALCIUM 9.8 9.3   PT/INR No results for input(s): LABPROT, INR in the last 72 hours. ABG No results for input(s): PHART, HCO3 in the last 72 hours.  Invalid input(s): PCO2, PO2  Studies/Results: Dg Abd Acute W/chest  Result Date: 12/12/2017 CLINICAL DATA:  Periumbilical abdominal pain. EXAM: DG ABDOMEN ACUTE W/ 1V CHEST COMPARISON:  Radiographs of August 05, 2014. FINDINGS: There is no evidence of dilated bowel loops or free intraperitoneal air. No radiopaque calculi or other significant radiographic abnormality is seen. Heart size and mediastinal contours are within normal limits. Mild bibasilar subsegmental atelectasis is noted. IMPRESSION: No evidence of bowel obstruction or ileus. Mild bibasilar subsegmental atelectasis. Electronically Signed   By: Lupita RaiderJames  Green Jr, M.D.   On: 12/12/2017 17:45    Anti-infectives: Anti-infectives (From admission, onward)   Start     Dose/Rate Route Frequency Ordered Stop   12/12/17 1900  cefOXitin (MEFOXIN) 2 g in dextrose 5 % 50 mL IVPB     2 g 100 mL/hr over 30 Minutes Intravenous  Once 12/12/17 1853 12/12/17 2020      Assessment/Plan: s/p Procedure(s): LAPAROSCOPIC UMBILICAL HERNIA  Repair (N/A)  Starting clear liquids Ambulate Hopefully home tomorrow if tolerating po  LOS: 0 days    Keith Larsen A 12/13/2017

## 2017-12-13 NOTE — Progress Notes (Signed)
Hypoglycemic Event  CBG: 69  Treatment: Orange juice   Symptoms: none  Follow-up CBG: Time:1737 CBG Result 95  Possible Reasons for Event: Inadequate po intake.  Comments/MD notified: Dr Salley HewsBlackman    Keith Larsen, Netty StarringBeth Larsen

## 2017-12-14 LAB — BASIC METABOLIC PANEL
Anion gap: 8 (ref 5–15)
BUN: 14 mg/dL (ref 6–20)
CALCIUM: 9.7 mg/dL (ref 8.9–10.3)
CHLORIDE: 105 mmol/L (ref 101–111)
CO2: 26 mmol/L (ref 22–32)
CREATININE: 0.96 mg/dL (ref 0.61–1.24)
GFR calc non Af Amer: >60 mL/min (ref >60)
GLUCOSE: 139 mg/dL — AB (ref 65–99)
Potassium: 4.6 mmol/L (ref 3.5–5.1)
Sodium: 139 mmol/L (ref 135–145)

## 2017-12-14 LAB — CBC
HEMATOCRIT: 32.8 % — AB (ref 39.0–52.0)
HEMOGLOBIN: 10.2 g/dL — AB (ref 13.0–17.0)
MCH: 27.9 pg (ref 26.0–34.0)
MCHC: 31.1 g/dL (ref 30.0–36.0)
MCV: 89.6 fL (ref 78.0–100.0)
Platelets: 269 10*3/uL (ref 150–400)
RBC: 3.66 MIL/uL — ABNORMAL LOW (ref 4.22–5.81)
RDW: 14.7 % (ref 11.5–15.5)
WBC: 6.3 10*3/uL (ref 4.0–10.5)

## 2017-12-14 LAB — GLUCOSE, CAPILLARY: Glucose-Capillary: 136 mg/dL — ABNORMAL HIGH (ref 65–99)

## 2017-12-14 MED ORDER — HYDROCODONE-ACETAMINOPHEN 5-325 MG PO TABS: 1.0000 | ORAL_TABLET | Freq: Three times a day (TID) | ORAL | 0 refills | Status: DC | PRN

## 2017-12-14 MED ORDER — ACETAMINOPHEN 325 MG PO TABS: 650.0000 mg | ORAL_TABLET | Freq: Three times a day (TID) | ORAL | Status: DC

## 2017-12-14 NOTE — Discharge Summary (Addendum)
[facility] Discharge Summary   Patient ID: Keith Pitmanaul Keith Larsen MRN: [id number] DOB/AGE: 55/14/1962 55 y.o.  Admit date: 12/12/2017 Discharge date: 12/14/2017   Discharge Diagnosis Patient Active Problem List   Diagnosis Date Noted  . Incarcerated umbilical hernia 12/12/2017  . Syncope 08/05/2014  . Hypoxia 08/05/2014  . Type 2 diabetes mellitus (HCC) 08/05/2014    Imaging: Dg Abd Acute W/chest  Result Date: 12/12/2017 CLINICAL DATA:  Periumbilical abdominal pain. EXAM: DG ABDOMEN ACUTE W/ 1V CHEST COMPARISON:  Radiographs of August 05, 2014. FINDINGS: There is no evidence of dilated bowel loops or free intraperitoneal air. No radiopaque calculi or other significant radiographic abnormality is seen. Heart size and mediastinal contours are within normal limits. Mild bibasilar subsegmental atelectasis is noted. IMPRESSION: No evidence of bowel obstruction or ileus. Mild bibasilar subsegmental atelectasis. Electronically Signed   By: Lupita RaiderJames  Green Jr, M.D.   On: 12/12/2017 17:45    Procedures Dr. Ovidio Kinavid Newman (12/12/17) - Procedure(s): LAPAROSCOPIC exploration and open UMBILICAL HERNIA  Repair  Hospital Course:  57 y/o AA male with PMH obesity, syncope, and DM [facility] of worsening pain at umbilical hernia site associated with nausea and vomiting. On physical exam the patient had an incarcerated umbilical hernia and he was taken for the above operation. He tolerated the procedure well and was transferred to the floor. Diet was advanced as tolerated. On POD#2 the patients vitals we stable, tolerating PO, pain controlled, ambulating in hallway, and having bowel function. He was discharged home with appropriate post-operative instructions. He will follow up with Dr. Ezzard StandingNewman in 2-3 weeks and knows to call with questions or concerns.  Physical Exam: General:  Alert, NAD, pleasant, comfortable Abd:  Soft, nontender, nondistended, +BS, umbilical and laparoscopic  incision sites are c/d/i without surrounding erythema. No staples - absorbable sutures and skin glue.   Allergies as of 12/14/2017   No Known Allergies     Medication List    TAKE these medications   acetaminophen 325 MG tablet Commonly known as:  TYLENOL Take 2 tablets (650 mg total) by mouth every 8 (eight) hours.   HYDROcodone-acetaminophen 5-325 MG tablet Commonly known as:  NORCO/VICODIN Take 1 tablet by mouth every 8 (eight) hours as needed for moderate pain.   ibuprofen 200 MG tablet Commonly known as:  ADVIL,MOTRIN Take 400 mg by mouth every 6 (six) hours as needed for headache, mild pain or moderate pain.        Follow-up Information    Ovidio KinNewman, David, MD. Call.   Specialty:  General Surgery Why:  schedule a post-operative appointment for the next 2-4 weeks. Contact information: [address] [place] [address] [phone]           Signed: Hosie SpangleElizabeth Aliyha Fornes, [facility] 12/14/2017, 9:31 AM Pager: [phone] Consults: [phone] Mon-Fri 7:00 am-4:30 pm Sat-Sun 7:00 am-11:30 am

## 2017-12-14 NOTE — Discharge Instructions (Signed)
[facility]      [facility] [phone]  OPEN ABDOMINAL SURGERY: POST OP INSTRUCTIONS  Always review your discharge instruction sheet given to you by the facility where your surgery was performed.  IF YOU HAVE DISABILITY OR FAMILY LEAVE FORMS, YOU MUST BRING THEM TO THE OFFICE FOR PROCESSING.  PLEASE DO NOT GIVE THEM TO YOUR DOCTOR.  1. A prescription for pain medication may be given to you upon discharge.  Take your pain medication as prescribed, if needed.  If narcotic pain medicine is not needed, then you may take acetaminophen (Tylenol) or ibuprofen (Advil) as needed. 2. Take your usually prescribed medications unless otherwise directed. 3. If you need a refill on your pain medication, please contact your pharmacy. They will contact our office to request authorization.  Prescriptions will not be filled after 5pm or on week-ends. 4. You should follow a light diet the first few days after arrival home, such as soup and crackers, pudding, etc.unless your doctor has advised otherwise. A high-fiber, low fat diet can be resumed as tolerated.   Be sure to include lots of fluids daily. Most patients will experience some swelling and bruising on the chest and neck area.  Ice packs will help.  Swelling and bruising can take several days to resolve 5. Most patients will experience some swelling and bruising in the area of the incision. Ice pack will help. Swelling and bruising can take several days to resolve..  6. It is common to experience some constipation if taking pain medication after surgery.  Increasing fluid intake and taking a stool softener will usually help or prevent this problem from occurring.  A mild laxative (Milk of Magnesia or Miralax) should be taken according to package directions if there are no bowel movements after 48 hours. 7.  You may have steri-strips (small skin tapes) in place directly over the incision.  These strips should be left on the skin for 7-10 days.  If your  surgeon used skin glue on the incision, you may shower in 24 hours.  The glue will flake off over the next 2-3 weeks.  Any sutures or staples will be removed at the office during your follow-up visit. You may find that a light gauze bandage over your incision may keep your staples from being rubbed or pulled. You may shower and replace the bandage daily. 8. ACTIVITIES:  You may resume regular (light) daily activities beginning the next day--such as daily self-care, walking, climbing stairs--gradually increasing activities as tolerated.  You may have sexual intercourse when it is comfortable.  Refrain from any heavy lifting or straining until approved by your doctor. a. You may drive when you no longer are taking prescription pain medication, you can comfortably wear a seatbelt, and you can safely maneuver your car and apply brakes b. Return to Work: ___________________________________ 9. You should see your doctor in the office for a follow-up appointment approximately two weeks after your surgery.  Make sure that you call for this appointment within a day or two after you arrive home to insure a convenient appointment time. OTHER INSTRUCTIONS:  _____________________________________________________________ _____________________________________________________________  WHEN TO CALL YOUR DOCTOR: 1. Fever over 101.0 2. Inability to urinate 3. Nausea and/or vomiting 4. Extreme swelling or bruising 5. Continued bleeding from incision. 6. Increased pain, redness, or drainage from the incision. 7. Difficulty swallowing or breathing 8. Muscle cramping or spasms. 9. Numbness or tingling in hands or feet or around lips.  The clinic staff is available to   answer your questions during regular business hours.  Please don't hesitate to call and ask to speak to one of the nurses if you have concerns.  For further questions, please visit [email or web address]   

## 2017-12-14 NOTE — Progress Notes (Signed)
Discharge instructions discussed with patient and family, verbalized agreement and understanding, prescriptions given to patient 

## 2020-02-17 ENCOUNTER — Emergency Department: Payer: PRIVATE HEALTH INSURANCE

## 2020-02-17 ENCOUNTER — Emergency Department
Admission: EM | Admit: 2020-02-17 | Discharge: 2020-02-17 | Disposition: A | Discharge status: Home / Self Care | Payer: PRIVATE HEALTH INSURANCE | Attending: Emergency Medicine | Admitting: Emergency Medicine

## 2020-02-17 ENCOUNTER — Other Ambulatory Visit: Payer: Self-pay

## 2020-02-17 DIAGNOSIS — Y9367 Activity, basketball: Secondary | ICD-10-CM | POA: Insufficient documentation

## 2020-02-17 DIAGNOSIS — S8992XA Unspecified injury of left lower leg, initial encounter: Secondary | ICD-10-CM | POA: Diagnosis present

## 2020-02-17 DIAGNOSIS — Q682 Congenital deformity of knee: Secondary | ICD-10-CM | POA: Diagnosis not present

## 2020-02-17 DIAGNOSIS — Y998 Other external cause status: Secondary | ICD-10-CM | POA: Insufficient documentation

## 2020-02-17 DIAGNOSIS — W500XXA Accidental hit or strike by another person, initial encounter: Secondary | ICD-10-CM | POA: Diagnosis not present

## 2020-02-17 DIAGNOSIS — E119 Type 2 diabetes mellitus without complications: Secondary | ICD-10-CM | POA: Diagnosis not present

## 2020-02-17 DIAGNOSIS — Z20822 Contact with and (suspected) exposure to covid-19: Secondary | ICD-10-CM | POA: Insufficient documentation

## 2020-02-17 DIAGNOSIS — Y9289 Other specified places as the place of occurrence of the external cause: Secondary | ICD-10-CM | POA: Diagnosis not present

## 2020-02-17 DIAGNOSIS — S86812A Strain of other muscle(s) and tendon(s) at lower leg level, left leg, initial encounter: Secondary | ICD-10-CM | POA: Diagnosis not present

## 2020-02-17 MED ORDER — HYDROCODONE-ACETAMINOPHEN 5-325 MG PO TABS: 1.0000 | ORAL_TABLET | Freq: Four times a day (QID) | ORAL | 0 refills | Status: DC | PRN

## 2020-02-17 MED ORDER — HYDROCODONE-ACETAMINOPHEN 5-325 MG PO TABS
1.0000 | ORAL_TABLET | Freq: Once | ORAL | Status: AC
  Administered 2020-02-17: 1 via ORAL
  Filled 2020-02-17: qty 1

## 2020-02-17 NOTE — Discharge Instructions (Signed)
Wear the knee immobilizer for support. Rest with leg elevated when seated. Apply ice to reduce pain and swelling. Take the pain medicine as needed. Call Dr. Joice Lofts on Monday for further instructions regarding possible surgery on Monday afternoon.

## 2020-02-17 NOTE — ED Triage Notes (Signed)
Pt comes POV with left knee pain/possible dislocation after colliding knees with someone playing basketball. Pt knee swollen and looks slightly deformed. Can't bend or put pressure.

## 2020-02-17 NOTE — ED Provider Notes (Signed)
[facility] Emergency Department Provider Note ____________________________________________  Time seen: 2102  I have reviewed the triage vital signs and the nursing notes.  HISTORY  Chief Complaint  Knee Pain  HPI Keith Larsen is a 59 y.o. male presents to the ED for evaluation of left knee pain and disability.  Patient was apparently playing basketball at a family gathering, when he was kneed in the lateral aspect of his left leg at the knee joint.  Patient assumed initially he had a dislocated kneecap, when he fell to the ground.  He was unable to stand up and bear weight through the left leg since time of the injury.  He presents now with swelling and effusion as well as disability to the left leg.  No other injury reported at this time.   Past Medical History:  Diagnosis Date  . Diabetes mellitus without complication [facility])     Patient Active Problem List   Diagnosis Date Noted  . Incarcerated umbilical hernia 86/[id number]/7209  . Syncope 08/05/2014  . Hypoxia 08/05/2014  . Type 2 diabetes mellitus ([place]) 08/05/2014    Past Surgical History:  Procedure Laterality Date  . UMBILICAL HERNIA REPAIR N/A 12/12/2017   Procedure: LAPAROSCOPIC UMBILICAL HERNIA  Repair;  Surgeon: Alphonsa Overall, MD;  Location: [facility] ORS;  Service: General;  Laterality: N/A;    Prior to Admission medications   Medication Sig Start Date End Date Taking? Authorizing Provider  acetaminophen (TYLENOL) 325 MG tablet Take 2 tablets (650 mg total) by mouth every 8 (eight) hours. 12/14/17   Jill Alexanders, PA-C  HYDROcodone-acetaminophen (NORCO/VICODIN) 5-325 MG tablet Take 1 tablet by mouth every 8 (eight) hours as needed for moderate pain. 12/14/17   Jill Alexanders, PA-C  ibuprofen (ADVIL,MOTRIN) 200 MG tablet Take 400 mg by mouth every 6 (six) hours as needed for headache, mild pain or moderate pain.    [provider]    Allergies Patient has no known allergies.  History  reviewed. No pertinent family history.  Social History Social History   Tobacco Use  . Smoking status: Never Smoker  . Smokeless tobacco: Never Used  Substance Use Topics  . Alcohol use: Yes    Comment: occasionally  . Drug use: Not on file    Review of Systems  Constitutional: Negative for fever. Cardiovascular: Negative for chest pain. Musculoskeletal: Negative for back pain. Right knee pain and disability as above.  Skin: Negative for rash. Neurological: Negative for headaches, focal weakness or numbness. ____________________________________________  PHYSICAL EXAM:  VITAL SIGNS: ED Triage Vitals  Enc Vitals Group     BP 02/17/20 1944 106/60     Pulse Rate 02/17/20 1944 86     Resp 02/17/20 1944 18     Temp 02/17/20 1944 98.4 F (36.9 C)     Temp Source 02/17/20 1944 Oral     SpO2 02/17/20 1944 96 %     Weight 02/17/20 1945 255 lb (115.7 kg)     Height 02/17/20 1945 6' (1.829 m)     Head Circumference --      Peak Flow --      Pain Score 02/17/20 1945 4     Pain Loc --      Pain Edu? --      Excl. in [place]? --     Constitutional: Alert and oriented. Well appearing and in no distress. Head: Normocephalic and atraumatic. Eyes: Conjunctivae are normal. Normal extraocular movements Cardiovascular: Normal rate, regular rhythm. Normal distal pulses.  Respiratory: Normal respiratory effort. No wheezes/rales/rhonchi. Gastrointestinal: Soft and nontender. No distention. Musculoskeletal: Left knee with joint effusion noted.  Patient with a high riding patella on gross exam.  He is unable to engage his quadricep muscle on the left.  He is also unable to perform a straight leg raise on the left.  No calf or Achilles tenderness noted distally.  Nontender with normal range of motion in all extremities.  Neurologic:  Normal gross sensation. Normal speech and language. No gross focal neurologic deficits are appreciated. Skin:  Skin is warm, dry and intact. No rash  noted. ____________________________________________   RADIOLOGY  DG left knee  IMPRESSION: Patella Alta with soft tissue edema in the region of the patellar tendon. Findings may represent patellar tendon injury. No evidence of acute fracture. ____________________________________________  LABORATORY  Labs Reviewed  SARS CORONAVIRUS 2 (TAT 6-24 HRS)  ____________________________________________  PROCEDURES  Knee immobilizer  Crutches Norco 5-325 mg PO  Procedures ____________________________________________  INITIAL IMPRESSION / ASSESSMENT AND PLAN / ED COURSE  Patient with ED evaluation of acute left knee pain and disability following an a mechanical injury.  Patient apparently was inadvertently kneed to the lateral aspect of his lower leg, and thought he sustained a dislocation of his kneecap.  He was found on exam to have a high riding patella as well as dysfunction of his quadricep muscle.  Patient was placed in a knee immobilizer and will be treated in the outpatient setting for patella tendon rupture.  ----------------------------------------- 9:21 PM on 02/17/2020 ----------------------------------------- S/W Poggi: he wants knee immoiblizer and crutches. He also wants COVID in preparation for anticipated surgical intervention next week. He ask patient to be NPO after 8 am on Monday.   Keith Larsen was evaluated in Emergency Department on 02/17/2020 for the symptoms described in the history of present illness. He was evaluated in the context of the global COVID-19 pandemic, which necessitated consideration that the patient might be at risk for infection with the SARS-CoV-2 virus that causes COVID-19. Institutional protocols and algorithms that pertain to the evaluation of patients at risk for COVID-19 are in a state of rapid change based on information released by regulatory bodies including the CDC and federal and state organizations. These policies and algorithms were  followed during the patient's care in the ED.  I reviewed the patient's prescription history over the last 12 months in the multi-state controlled substances database(s) that includes [place], [place], [place], [place], [place], [place], [place], [place], [place], [place], [place], [place], [place], and [place].  Results were notable for no current Rx history.  ____________________________________________  FINAL CLINICAL IMPRESSION(S) / ED DIAGNOSES  Final diagnoses:  Patella alta  Patellar tendon rupture, left, initial encounter      Lissa Hoard, PA-C 02/17/20 2215    Arnaldo Natal, MD 02/18/20 0021

## 2020-02-18 LAB — SARS CORONAVIRUS 2 (TAT 6-24 HRS): SARS Coronavirus 2: NEGATIVE

## 2020-02-19 ENCOUNTER — Other Ambulatory Visit: Admission: RE | Admit: 2020-02-19 | Payer: PRIVATE HEALTH INSURANCE | Source: Ambulatory Visit

## 2020-02-19 ENCOUNTER — Other Ambulatory Visit: Payer: Self-pay | Admitting: Surgery

## 2020-02-20 ENCOUNTER — Ambulatory Visit: Payer: PRIVATE HEALTH INSURANCE | Admitting: Certified Registered Nurse Anesthetist

## 2020-02-20 ENCOUNTER — Encounter
Admission: RE | Disposition: A | Discharge status: Home / Self Care | Payer: Self-pay | Source: Home / Self Care | Attending: Surgery

## 2020-02-20 ENCOUNTER — Encounter: Payer: Self-pay | Admitting: Surgery

## 2020-02-20 ENCOUNTER — Other Ambulatory Visit: Payer: Self-pay

## 2020-02-20 ENCOUNTER — Ambulatory Visit
Admission: RE | Admit: 2020-02-20 | Discharge: 2020-02-20 | Disposition: A | Discharge status: Home / Self Care | Payer: PRIVATE HEALTH INSURANCE | Attending: Surgery | Admitting: Surgery

## 2020-02-20 DIAGNOSIS — Z791 Long term (current) use of non-steroidal anti-inflammatories (NSAID): Secondary | ICD-10-CM | POA: Diagnosis not present

## 2020-02-20 DIAGNOSIS — S76112A Strain of left quadriceps muscle, fascia and tendon, initial encounter: Secondary | ICD-10-CM | POA: Insufficient documentation

## 2020-02-20 DIAGNOSIS — E669 Obesity, unspecified: Secondary | ICD-10-CM | POA: Diagnosis not present

## 2020-02-20 DIAGNOSIS — W500XXA Accidental hit or strike by another person, initial encounter: Secondary | ICD-10-CM | POA: Diagnosis not present

## 2020-02-20 DIAGNOSIS — E119 Type 2 diabetes mellitus without complications: Secondary | ICD-10-CM | POA: Insufficient documentation

## 2020-02-20 DIAGNOSIS — Z6834 Body mass index (BMI) 34.0-34.9, adult: Secondary | ICD-10-CM | POA: Insufficient documentation

## 2020-02-20 HISTORY — PX: PATELLAR TENDON REPAIR: SHX737

## 2020-02-20 LAB — GLUCOSE, CAPILLARY
Glucose-Capillary: 220 mg/dL — ABNORMAL HIGH (ref 70–99)
Glucose-Capillary: 219 mg/dL — ABNORMAL HIGH (ref 70–99)

## 2020-02-20 SURGERY — REPAIR, TENDON, PATELLAR
Anesthesia: General | Site: Knee | Laterality: Left

## 2020-02-20 MED ORDER — DEXAMETHASONE SODIUM PHOSPHATE 10 MG/ML IJ SOLN
INTRAMUSCULAR | Status: DC | PRN
  Administered 2020-02-20: 10 mg via INTRAVENOUS

## 2020-02-20 MED ORDER — BUPIVACAINE-EPINEPHRINE (PF) 0.5% -1:200000 IJ SOLN
INTRAMUSCULAR | Status: DC | PRN
  Administered 2020-02-20: 30 mL

## 2020-02-20 MED ORDER — HYDROCODONE-ACETAMINOPHEN 5-325 MG PO TABS
1.0000 | ORAL_TABLET | ORAL | Status: DC | PRN
  Administered 2020-02-20: 2 via ORAL

## 2020-02-20 MED ORDER — KETAMINE HCL 50 MG/ML IJ SOLN
INTRAMUSCULAR | Status: AC
  Filled 2020-02-20: qty 10

## 2020-02-20 MED ORDER — FAMOTIDINE 20 MG PO TABS
ORAL_TABLET | ORAL | Status: AC
  Administered 2020-02-20: 20 mg via ORAL
  Filled 2020-02-20: qty 1

## 2020-02-20 MED ORDER — FAMOTIDINE 20 MG PO TABS: 20.0000 mg | ORAL_TABLET | Freq: Once | ORAL | Status: AC

## 2020-02-20 MED ORDER — FENTANYL CITRATE (PF) 100 MCG/2ML IJ SOLN
INTRAMUSCULAR | Status: AC
  Filled 2020-02-20: qty 2

## 2020-02-20 MED ORDER — FENTANYL CITRATE (PF) 100 MCG/2ML IJ SOLN: 25.0000 ug | INTRAMUSCULAR | Status: DC | PRN

## 2020-02-20 MED ORDER — ONDANSETRON HCL 4 MG PO TABS: 4.0000 mg | ORAL_TABLET | Freq: Four times a day (QID) | ORAL | Status: DC | PRN

## 2020-02-20 MED ORDER — HYDROCODONE-ACETAMINOPHEN 5-325 MG PO TABS: 1.0000 | ORAL_TABLET | Freq: Four times a day (QID) | ORAL | 0 refills | Status: AC | PRN

## 2020-02-20 MED ORDER — MIDAZOLAM HCL 2 MG/2ML IJ SOLN
INTRAMUSCULAR | Status: AC
  Filled 2020-02-20: qty 2

## 2020-02-20 MED ORDER — METOCLOPRAMIDE HCL 10 MG PO TABS: 5.0000 mg | ORAL_TABLET | Freq: Three times a day (TID) | ORAL | Status: DC | PRN

## 2020-02-20 MED ORDER — ONDANSETRON HCL 4 MG/2ML IJ SOLN
INTRAMUSCULAR | Status: DC | PRN
  Administered 2020-02-20: 4 mg via INTRAVENOUS

## 2020-02-20 MED ORDER — MIDAZOLAM HCL 2 MG/2ML IJ SOLN
INTRAMUSCULAR | Status: DC | PRN
  Administered 2020-02-20: 2 mg via INTRAVENOUS

## 2020-02-20 MED ORDER — SEVOFLURANE IN SOLN
RESPIRATORY_TRACT | Status: AC
  Filled 2020-02-20: qty 250

## 2020-02-20 MED ORDER — PHENYLEPHRINE HCL (PRESSORS) 10 MG/ML IV SOLN
INTRAVENOUS | Status: DC | PRN
  Administered 2020-02-20 (×3): 200 ug via INTRAVENOUS
  Administered 2020-02-20: 100 ug via INTRAVENOUS

## 2020-02-20 MED ORDER — LACTATED RINGERS IV SOLN: INTRAVENOUS | Status: DC | PRN

## 2020-02-20 MED ORDER — ONDANSETRON HCL 4 MG/2ML IJ SOLN: 4.0000 mg | Freq: Four times a day (QID) | INTRAMUSCULAR | Status: DC | PRN

## 2020-02-20 MED ORDER — CEFAZOLIN SODIUM-DEXTROSE 2-4 GM/100ML-% IV SOLN
INTRAVENOUS | Status: AC
  Filled 2020-02-20: qty 100

## 2020-02-20 MED ORDER — CHLORHEXIDINE GLUCONATE 0.12 % MT SOLN
OROMUCOSAL | Status: AC
  Administered 2020-02-20: 15 mL via OROMUCOSAL
  Filled 2020-02-20: qty 15

## 2020-02-20 MED ORDER — FENTANYL CITRATE (PF) 100 MCG/2ML IJ SOLN
INTRAMUSCULAR | Status: DC | PRN
  Administered 2020-02-20: 25 ug via INTRAVENOUS
  Administered 2020-02-20: 50 ug via INTRAVENOUS
  Administered 2020-02-20: 25 ug via INTRAVENOUS
  Administered 2020-02-20: 50 ug via INTRAVENOUS

## 2020-02-20 MED ORDER — EPINEPHRINE PF 1 MG/ML IJ SOLN
INTRAMUSCULAR | Status: AC
  Filled 2020-02-20: qty 1

## 2020-02-20 MED ORDER — KETAMINE HCL 10 MG/ML IJ SOLN
INTRAMUSCULAR | Status: DC | PRN
Start: 2020-02-20 — End: 2020-02-20
  Administered 2020-02-20: 25 mg via INTRAVENOUS

## 2020-02-20 MED ORDER — PROPOFOL 10 MG/ML IV BOLUS
INTRAVENOUS | Status: AC
  Filled 2020-02-20: qty 20

## 2020-02-20 MED ORDER — CEFAZOLIN SODIUM-DEXTROSE 2-4 GM/100ML-% IV SOLN
2.0000 g | INTRAVENOUS | Status: AC
  Administered 2020-02-20: 2 g via INTRAVENOUS

## 2020-02-20 MED ORDER — KETOROLAC TROMETHAMINE 30 MG/ML IJ SOLN: 30.0000 mg | Freq: Once | INTRAMUSCULAR | Status: AC

## 2020-02-20 MED ORDER — KETOROLAC TROMETHAMINE 30 MG/ML IJ SOLN
INTRAMUSCULAR | Status: AC
  Administered 2020-02-20: 30 mg via INTRAVENOUS
  Filled 2020-02-20: qty 1

## 2020-02-20 MED ORDER — PROPOFOL 10 MG/ML IV BOLUS
INTRAVENOUS | Status: DC | PRN
  Administered 2020-02-20: 200 mg via INTRAVENOUS
  Administered 2020-02-20: 50 mg via INTRAVENOUS

## 2020-02-20 MED ORDER — BUPIVACAINE HCL (PF) 0.5 % IJ SOLN
INTRAMUSCULAR | Status: AC
  Filled 2020-02-20: qty 30

## 2020-02-20 MED ORDER — LIDOCAINE HCL (CARDIAC) PF 100 MG/5ML IV SOSY
PREFILLED_SYRINGE | INTRAVENOUS | Status: DC | PRN
  Administered 2020-02-20: 100 mg via INTRAVENOUS

## 2020-02-20 MED ORDER — POTASSIUM CHLORIDE IN NACL 20-0.9 MEQ/L-% IV SOLN: INTRAVENOUS | Status: DC

## 2020-02-20 MED ORDER — HYDROCODONE-ACETAMINOPHEN 5-325 MG PO TABS
ORAL_TABLET | ORAL | Status: AC
  Filled 2020-02-20: qty 2

## 2020-02-20 MED ORDER — CHLORHEXIDINE GLUCONATE 0.12 % MT SOLN: 15.0000 mL | Freq: Once | OROMUCOSAL | Status: AC

## 2020-02-20 MED ORDER — PROMETHAZINE HCL 25 MG/ML IJ SOLN: 6.2500 mg | INTRAMUSCULAR | Status: DC | PRN

## 2020-02-20 MED ORDER — ORAL CARE MOUTH RINSE: 15.0000 mL | Freq: Once | OROMUCOSAL | Status: AC

## 2020-02-20 MED ORDER — SODIUM CHLORIDE 0.9 % IV SOLN: INTRAVENOUS | Status: DC

## 2020-02-20 MED ORDER — METOCLOPRAMIDE HCL 5 MG/ML IJ SOLN: 5.0000 mg | Freq: Three times a day (TID) | INTRAMUSCULAR | Status: DC | PRN

## 2020-02-20 SURGICAL SUPPLY — 50 items
BLADE SURG SZ10 CARB STEEL (BLADE) ×6 IMPLANT
BNDG COHESIVE 4X5 TAN STRL (GAUZE/BANDAGES/DRESSINGS) ×3 IMPLANT
BNDG ELASTIC 4X5.8 VLCR STR LF (GAUZE/BANDAGES/DRESSINGS) IMPLANT
BNDG ELASTIC 6X5.8 VLCR STR LF (GAUZE/BANDAGES/DRESSINGS) ×3 IMPLANT
BNDG ESMARK 6X12 TAN STRL LF (GAUZE/BANDAGES/DRESSINGS) ×3 IMPLANT
BRACE KNEE POST OP SHORT (BRACE) ×3 IMPLANT
CANISTER SUCT 1200ML W/VALVE (MISCELLANEOUS) ×3 IMPLANT
CHLORAPREP W/TINT 26 (MISCELLANEOUS) ×3 IMPLANT
COOLER POLAR GLACIER W/PUMP (MISCELLANEOUS) ×3 IMPLANT
COVER WAND RF STERILE (DRAPES) ×3 IMPLANT
CUFF TOURN SGL QUICK 30 (TOURNIQUET CUFF) ×3
CUFF TRNQT CYL 30X4X21-28X (TOURNIQUET CUFF) ×1 IMPLANT
DRAPE SPLIT 6X30 W/TAPE (DRAPES) ×6 IMPLANT
ELECT REM PT RETURN 9FT ADLT (ELECTROSURGICAL) ×3
ELECTRODE REM PT RTRN 9FT ADLT (ELECTROSURGICAL) ×1 IMPLANT
GAUZE SPONGE 4X4 12PLY STRL (GAUZE/BANDAGES/DRESSINGS) ×3 IMPLANT
GLOVE BIO SURGEON STRL SZ8 (GLOVE) ×6 IMPLANT
GLOVE INDICATOR 8.0 STRL GRN (GLOVE) ×3 IMPLANT
GOWN STRL REUS W/ TWL LRG LVL3 (GOWN DISPOSABLE) ×2 IMPLANT
GOWN STRL REUS W/ TWL XL LVL3 (GOWN DISPOSABLE) ×1 IMPLANT
GOWN STRL REUS W/TWL LRG LVL3 (GOWN DISPOSABLE) ×6
GOWN STRL REUS W/TWL XL LVL3 (GOWN DISPOSABLE) ×3
KIT TURNOVER KIT A (KITS) ×3 IMPLANT
NDL MAYO CATGUT SZ5 (NEEDLE) ×3
NDL SUT 5 .5 CRC TROC PNT MAYO (NEEDLE) ×1 IMPLANT
NEEDLE FILTER BLUNT 18X 1/2SAF (NEEDLE) ×2
NEEDLE FILTER BLUNT 18X1 1/2 (NEEDLE) ×1 IMPLANT
NS IRRIG 500ML POUR BTL (IV SOLUTION) ×3 IMPLANT
PACK EXTREMITY (MISCELLANEOUS) ×3 IMPLANT
PAD CAST CTTN 4X4 STRL (SOFTGOODS) IMPLANT
PAD WRAPON POLAR KNEE (MISCELLANEOUS) ×1 IMPLANT
PADDING CAST COTTON 4X4 STRL (SOFTGOODS)
SPONGE LAP 18X18 RF (DISPOSABLE) ×3 IMPLANT
STAPLER SKIN PROX 35W (STAPLE) ×3 IMPLANT
STOCKINETTE IMPERVIOUS 9X36 MD (GAUZE/BANDAGES/DRESSINGS) IMPLANT
STOCKINETTE M/LG 89821 (MISCELLANEOUS) IMPLANT
SUT ETHIBOND #5 BRAIDED 30INL (SUTURE) IMPLANT
SUT ETHIBOND CT1 BRD #0 30IN (SUTURE) IMPLANT
SUT FIBERWIRE #2 38 BLUE 1/2 (SUTURE) ×12
SUT VIC AB 2-0 CT1 (SUTURE) ×3 IMPLANT
SUT VIC AB 2-0 CT1 27 (SUTURE) ×6
SUT VIC AB 2-0 CT1 TAPERPNT 27 (SUTURE) ×2 IMPLANT
SUT VIC AB 3-0 SH 27 (SUTURE) ×3
SUT VIC AB 3-0 SH 27X BRD (SUTURE) ×1 IMPLANT
SUTURE FIBERWR #2 38 BLUE 1/2 (SUTURE) ×4 IMPLANT
SYR 10ML LL (SYRINGE) IMPLANT
SYR 20ML LL LF (SYRINGE) ×3 IMPLANT
SYS INTERNAL BRACE KNEE (Miscellaneous) ×6 IMPLANT
SYSTEM INTERNAL BRACE KNEE (Miscellaneous) ×2 IMPLANT
WRAPON POLAR PAD KNEE (MISCELLANEOUS) ×3

## 2020-02-20 NOTE — Transfer of Care (Signed)
Immediate Anesthesia Transfer of Care Note  Patient: Keith Larsen  Procedure(s) Performed: PATELLA TENDON REPAIR (Left Knee)  Patient Location: PACU  Anesthesia Type:General  Level of Consciousness: sedated  Airway & Oxygen Therapy: Patient Spontanous Breathing and Patient connected to face mask oxygen  Post-op Assessment: Report given to RN and Post -op Vital signs reviewed and stable  Post vital signs: Reviewed and stable  Last Vitals:  Vitals Value Taken Time  BP 119/81 02/20/20 1821  Temp    Pulse 77 02/20/20 1821  Resp 12 02/20/20 1821  SpO2 97 % 02/20/20 1821  Vitals shown include unvalidated device data.  Last Pain:  Vitals:   02/20/20 1821  TempSrc:   PainSc: 0-No pain         Complications: No apparent anesthesia complications

## 2020-02-20 NOTE — Anesthesia Postprocedure Evaluation (Signed)
Anesthesia Post Note  Patient: Keith Larsen  Procedure(s) Performed: PATELLA TENDON REPAIR (Left Knee)  Patient location during evaluation: PACU Anesthesia Type: General Level of consciousness: awake and alert Pain management: pain level controlled Vital Signs Assessment: post-procedure vital signs reviewed and stable Respiratory status: spontaneous breathing, nonlabored ventilation and respiratory function stable Cardiovascular status: blood pressure returned to baseline and stable Postop Assessment: no apparent nausea or vomiting Anesthetic complications: no     Last Vitals:  Vitals:   02/20/20 1905 02/20/20 1920  BP: 137/88 (!) 130/93  Pulse: 78 74  Resp: 13 13  Temp:    SpO2: 96% 98%    Last Pain:  Vitals:   02/20/20 1920  TempSrc:   PainSc: 3                  [name]

## 2020-02-20 NOTE — OR Nursing (Signed)
EKG completed at this time, Dr. Karlton Lemon at bedside to review and placed in chart

## 2020-02-20 NOTE — Anesthesia Procedure Notes (Signed)
Procedure Name: LMA Insertion Date/Time: 02/20/2020 3:29 PM Performed by: Lanora Manis, CRNA Pre-anesthesia Checklist: Patient identified, Patient being monitored, Timeout performed, Emergency Drugs available and Suction available Patient Re-evaluated:Patient Re-evaluated prior to induction Oxygen Delivery Method: Circle system utilized Preoxygenation: Pre-oxygenation with 100% oxygen Induction Type: IV induction Ventilation: Mask ventilation without difficulty LMA: LMA inserted LMA Size: 5.0 Tube type: Oral Number of attempts: 1 Placement Confirmation: positive ETCO2 and breath sounds checked- equal and bilateral Tube secured with: Tape Dental Injury: Teeth and Oropharynx as per pre-operative assessment

## 2020-02-20 NOTE — H&P (Signed)
History of Present Illness: Keith Larsen is a 59 y.o. male who presents today for evaluation of left knee patellar tendon rupture. Patient was playing basketball 02/17/2020 when he was kneed in the lateral aspect of the knee, felt his kneecap dislocate medially, this was reduced and patient was noticed to have a deficit with his extension. He was evaluated in the ER where x-rays showed patella alta and soft spot along the patellar tendon. Is unable to perform knee extension. Pain has been mild to moderate. He has been nonweightbearing with crutches and a knee immobilizer. He comes in today to discuss patellar tendon repair. Patient taking Norco for pain. Pain control.  Patient does not smoke. No history of blood clots. Patient has a history of diabetes, states he is well controlled now through diet and exercise.  Past Medical History: . Diabetes mellitus without complication (CMS-HCC)   Past Surgical History: . ACHILLES TENDON REPAIR Left 1986  . HERNIA REPAIR   Past Family History: . Diabetes type II Father  . High blood pressure (Hypertension) Father  . High blood pressure (Hypertension) Sister   Medications: . HYDROcodone-acetaminophen (NORCO) 5-325 mg tablet Take by mouth  . ibuprofen (MOTRIN) 200 MG tablet Take by mouth   No current Epic-ordered facility-administered medications on file.   Allergies: No Known Allergies   Review of Systems:  A comprehensive 14 point ROS was performed, reviewed by me today, and the pertinent orthopaedic findings are documented in the HPI.  Physical Exam: BP 108/82  Wt (!) 116.8 kg (257 lb 6.4 oz)  General:  Well developed, well nourished, no apparent distress, normal affect, antalgic gait with crutches and knee immobilizer on left lower extremity.  HEENT: Head normocephalic, atraumatic, PERRL.   Abdomen: Soft, non tender, non distended, Bowel sounds present.  Heart: Examination of the heart reveals regular, rate, and rhythm. There is no  murmur noted on ascultation. There is a normal apical pulse.  Lungs: Lungs are clear to auscultation. There is no wheeze, rhonchi, or crackles. There is normal expansion of bilateral chest walls.   Left knee exam: Examination of the left leg shows slight effusion. Patella tracking well along midline. Unable to perform active extension or maintain extension of the left knee. Mostly tender along the patellar tendon with soft defect along the tendon space. Patient has slight tenderness still above the kneecap. He also has some tenderness along the lateral patella. Knee is stable to valgus and varus stress testing. Hip moves well with internal X rotation. No edema throughout the left leg. Calf is soft. Plantar flexion dorsiflexion is intact.  X-ray Exam:  Recent x-rays of the left knee are available for review.  These films demonstrate patellar alta.  Mild soft tissue edema in the region of the  patellar tendon.  No evidence of acute fracture. Normal tibiofemoral  alignment.  Possible small knee joint effusion.   Impression: Rupture of left patellar tendon.  Plan:  Risks, benefits, complications of a left knee patella tendon repair have been discussed with the patient. The patient has agreed and consented the procedure with Dr. Joice Lofts tomorrow on 02/20/2020. A postop knee hinged brace was distributed today in the office, he will take this to the OR tomorrow, this will be necessary for post-operative recovery. Anticipate patient will be out of work for at least 6 weeks.   H&P reviewed and patient re-examined. No changes.

## 2020-02-20 NOTE — Anesthesia Preprocedure Evaluation (Signed)
Anesthesia Evaluation  Patient identified by MRN, date of birth, ID band Patient awake    Reviewed: Allergy & Precautions, H&P , NPO status , Patient's Chart, lab work & pertinent test results, reviewed documented beta blocker date and time   History of Anesthesia Complications Negative for: history of anesthetic complications  Airway Mallampati: II  TM Distance: >3 FB Neck ROM: full    Dental  (+) Dental Advidsory Given, Partial Lower, Missing, Loose, Teeth Intact   Pulmonary neg pulmonary ROS,    Pulmonary exam normal breath sounds clear to auscultation       Cardiovascular Exercise Tolerance: Good negative cardio ROS Normal cardiovascular exam Rhythm:regular Rate:Normal     Neuro/Psych negative neurological ROS  negative psych ROS   GI/Hepatic negative GI ROS, Neg liver ROS,   Endo/Other  diabetes  Renal/GU negative Renal ROS  negative genitourinary   Musculoskeletal   Abdominal   Peds  Hematology negative hematology ROS (+)   Anesthesia Other Findings Past Medical History: No date: Diabetes mellitus without complication (HCC) Obesity  Reproductive/Obstetrics negative OB ROS                             Anesthesia Physical Anesthesia Plan  ASA: II  Anesthesia Plan: General   Post-op Pain Management:    Induction: Intravenous  PONV Risk Score and Plan: 2 and Ondansetron, Dexamethasone, Midazolam, Promethazine and Treatment may vary due to age or medical condition  Airway Management Planned: Oral ETT and LMA  Additional Equipment:   Intra-op Plan:   Post-operative Plan: Extubation in OR  Informed Consent: I have reviewed the patients History and Physical, chart, labs and discussed the procedure including the risks, benefits and alternatives for the proposed anesthesia with the patient or authorized representative who has indicated his/her understanding and acceptance.      Dental Advisory Given  Plan Discussed with: Anesthesiologist, CRNA and Surgeon  Anesthesia Plan Comments:         Anesthesia Quick Evaluation

## 2020-02-20 NOTE — Op Note (Signed)
02/20/2020  6:19 PM  Patient:   Keith Larsen  Pre-Op Diagnosis:   Acute patellar tendon rupture, left knee.  Post-Op Diagnosis:   Same.  Procedure:   Primary repair of acute patellar tendon rupture, left knee.  Surgeon:   Maryagnes Amos, MD  Assistant:   Nyra Jabs, PA-S  Anesthesia:   General LMA  Findings:   As above.  Complications:   None  EBL:   20 cc  Fluids:   700 cc crystalloid  TT:   90 minutes at 300 mmHg  Drains:   None  Closure:   Staples  Implants:   Arthrex 4.75 mm SwiveLock anchors x4 (Internal Brace x2)  Brief Clinical Note:   The patient is a 59 year old male who sustained the above-noted injury 3 days ago while playing basketball with his nephews. He was brought to the emergency room where x-rays and physical examination demonstrated a patella tendon rupture. He was placed in a knee immobilizer and presents at this time for definitive management of this injury.  Procedure:   The patient was brought into the operating room and lain in the supine position. After adequate general laryngeal mask anesthesia was obtained, the patient's left lower extremity was prepped with ChloraPrep solution before being draped sterilely. Preoperative antibiotics were administered. A timeout was performed to verify the appropriate surgical site before the limb was exsanguinated with an Esmarch and the tourniquet inflated to 300 mmHg.   An approximately 4-5 inch incision was made over the anterior aspect of the knee, centered over the patella. The incision was carried down through the subcutaneous tissues to expose the superficial retinaculum. This was split the length of the incision and the medial and lateral flaps elevated sufficiently to expose the patellar tendon. The patellar tendon was noted to be torn and a primarily delaminated pattern where the more superficial fibers remained attached to the inferior pole of the patella while the deeper layer remained attached to  the tibial tubercle. After examining the tear pattern, it was elected to proceed with a primary repair using multiple #2 FiberWire sutures. The knee was placed in approximately 40 degrees of flexion before two #2 FiberWire sutures were placed in a modified Krakw type fashion through each of the proximal and distal flaps. The sutures passed through the deeper inferior flap were then brought out anteriorly adjacent to the inferior pole of the patella. Next, the two additional #2 FiberWire sutures passed through the more superficial proximal flap, again in a modified Krakw type fashion, were then brought distally and secured through the tissues at the base of the tendon attached to the tibial tubercle. The patella tendon appeared to be quite well re-established.   Two Arthrex internal braces were then placed. An Arthrex 4.75 mm SwiveLock anchor was placed on either side of the inferior pole of thes patella adjacent to the patellar tendon attachment. Again, with the the at approximately 40 degrees of flexion, the fiber tapes were brought distally and secured using two additional Arthrex 4.75 mm SwiveLock anchors placed on either side of the tibial tubercle with care taken to avoid overtensioning the patellar tendon. Each of the #2 FiberWires contained within the more distal anchors were then passed back proximally through the proximal portion of the patellar tendon to further reinforce the repair. In addition, several additional #2 FiberWires were used to repair both the medial and lateral retinacular tears. Following this repair, the construct was stressed and found to be stable to gentle knee flexion  to 90.  The wound was copiously irrigated with sterile saline solution before the superficial retinacular layer was reapproximated using 2-0 Vicryl interrupted sutures. The subcutaneous tissues were closed in two layers using 2-0 Vicryl interrupted sutures before the skin was closed using staples. A total of 30 cc  of 0.5% Sensorcaine with epinephrine was injected in and around the incision site to help with postoperative analgesia before a sterile bulky dressing was applied to the knee. An Ace wrap was placed around the knee to accommodate a Polar Care pack before the patient was placed into a hinged knee brace with the hinges set at 0-90, but locked in extension. The patient was then awakened, extubated, and returned to the recovery room in satisfactory condition after tolerating the procedure well.

## 2020-02-20 NOTE — Discharge Instructions (Addendum)
AMBULATORY SURGERY  DISCHARGE INSTRUCTIONS   1) The drugs that you were given will stay in your system until tomorrow so for the next 24 hours you should not:  A) Drive an automobile B) Make any legal decisions C) Drink any alcoholic beverage   2) You may resume regular meals tomorrow.  Today it is better to start with liquids and gradually work up to solid foods.  You may eat anything you prefer, but it is better to start with liquids, then soup and crackers, and gradually work up to solid foods.   3) Please notify your doctor immediately if you have any unusual bleeding, trouble breathing, redness and pain at the surgery site, drainage, fever, or pain not relieved by medication. 4)   5) Your post-operative visit with Dr.                                     is: Date:                        Time:    Please call to schedule your post-operative visit.  6) Additional Instructions:     Orthopedic discharge instructions: Keep dressing dry and intact.  May sponge bathe after dressing changed on post-op day #4 (Saturday).  Cover staples with Band-Aids after drying off. Apply ice frequently to knee or use Polar Care. Take ibuprofen 600-800 mg TID with meals for 7-10 days, then as necessary. Take pain medication as prescribed when needed.  May supplement with ES Tylenol if necessary. May weight-bear as tolerated so long as in brace locked in extension - use crutches as needed. Follow-up in 10-14 days or as scheduled.

## 2021-02-19 IMAGING — CR DG KNEE COMPLETE 4+V*L*
4 series · 4 of 4 positions shown · non-contrast
Comparison: None.

CLINICAL DATA: Left knee pain after injury. Swelling. Injury
playing basketball.

EXAM:
LEFT KNEE - COMPLETE 4+ VIEW

[knee ap]
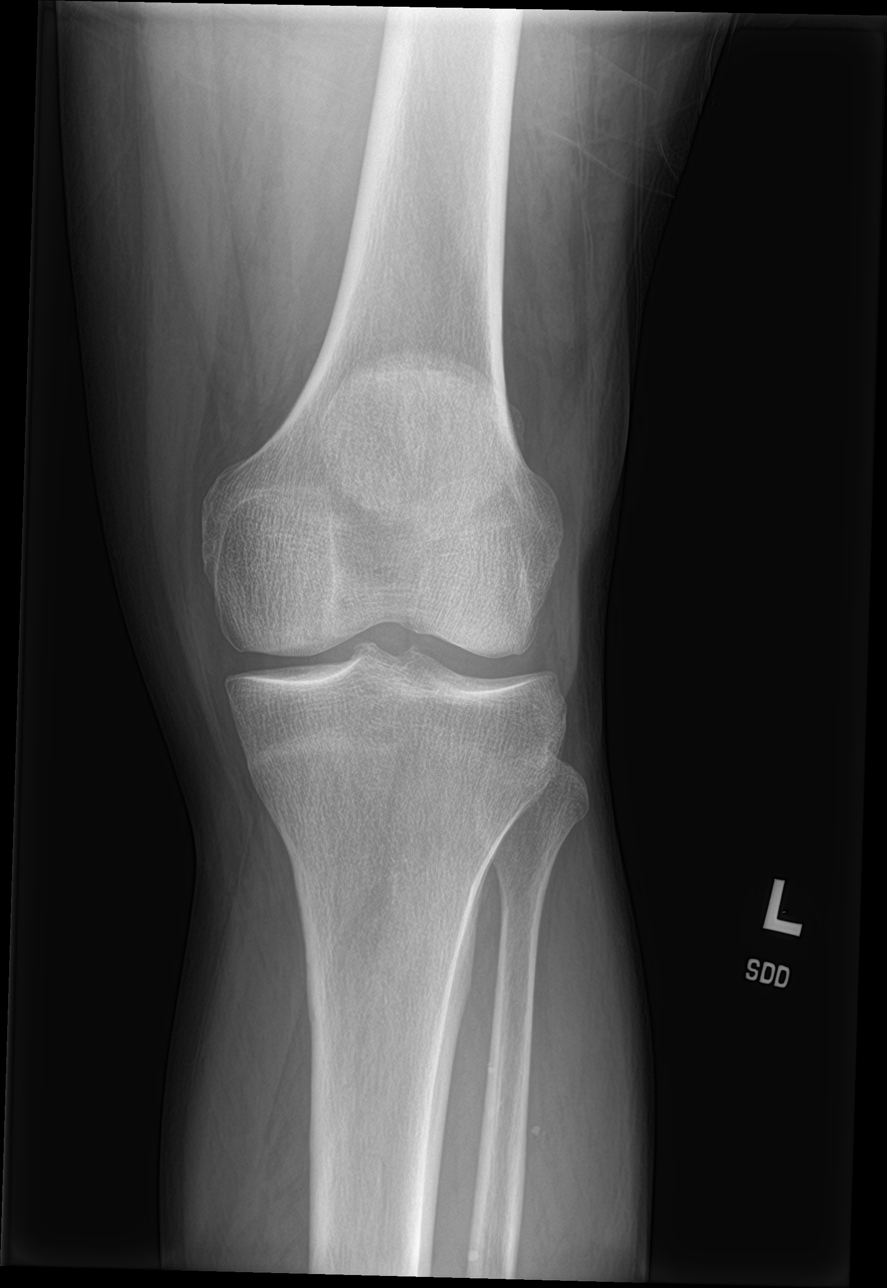

[knee obl (1 of 2)]
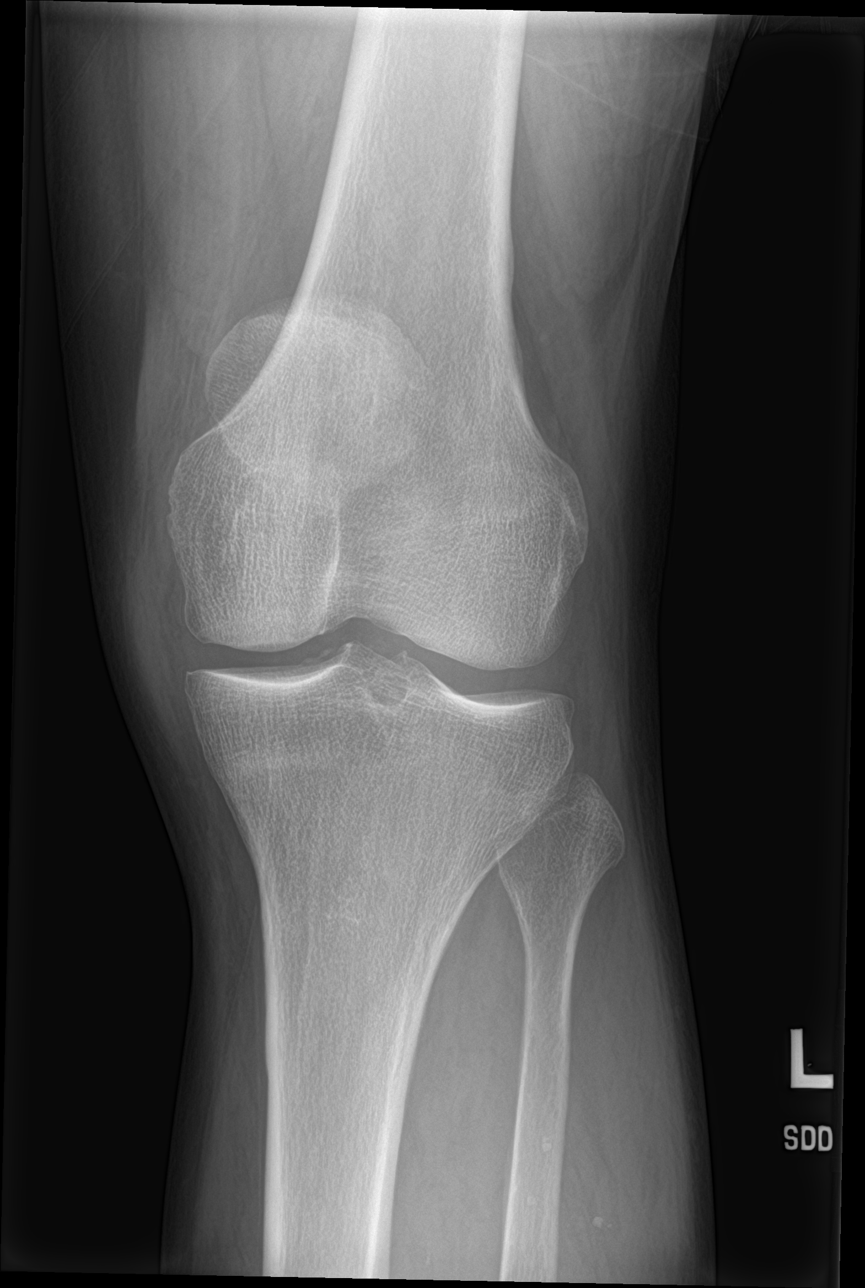

[knee obl (2 of 2)]
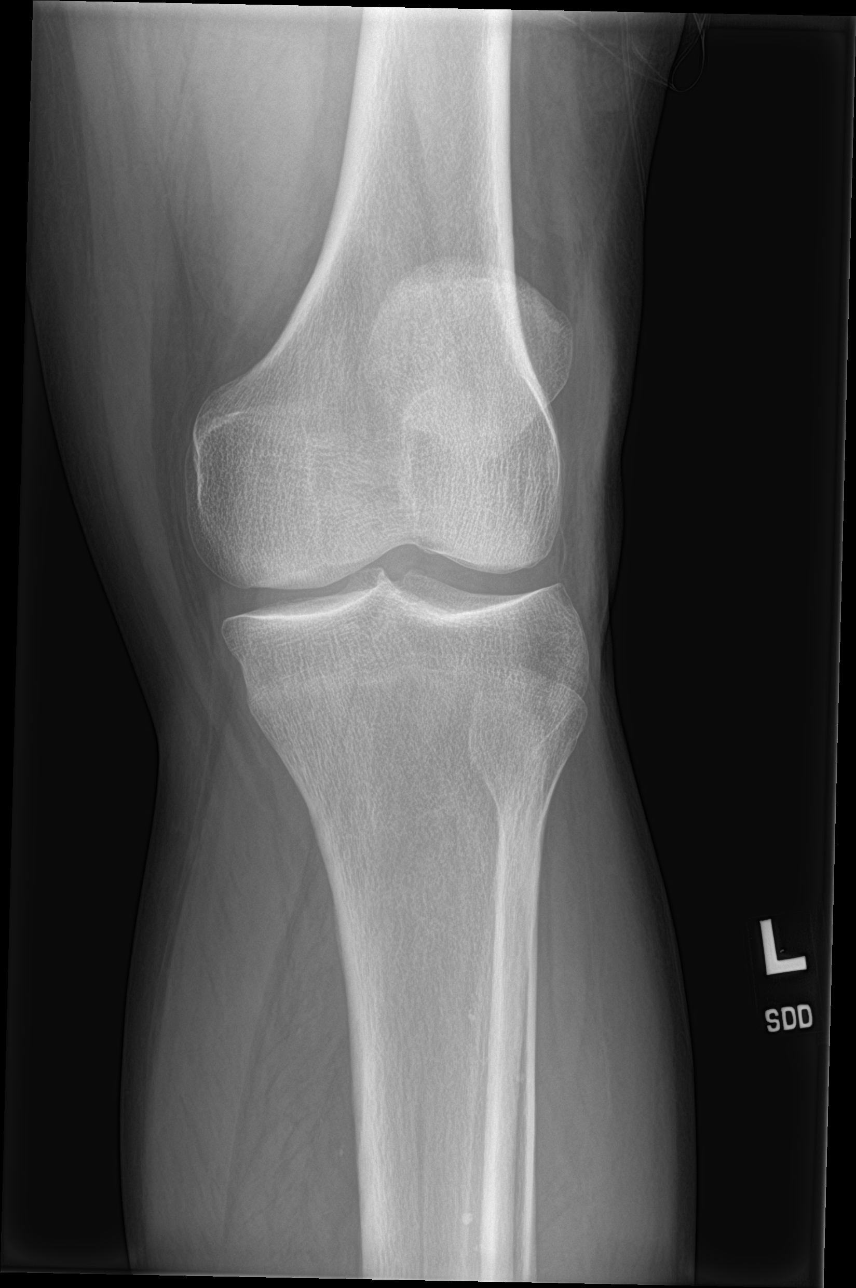

[knee lat]
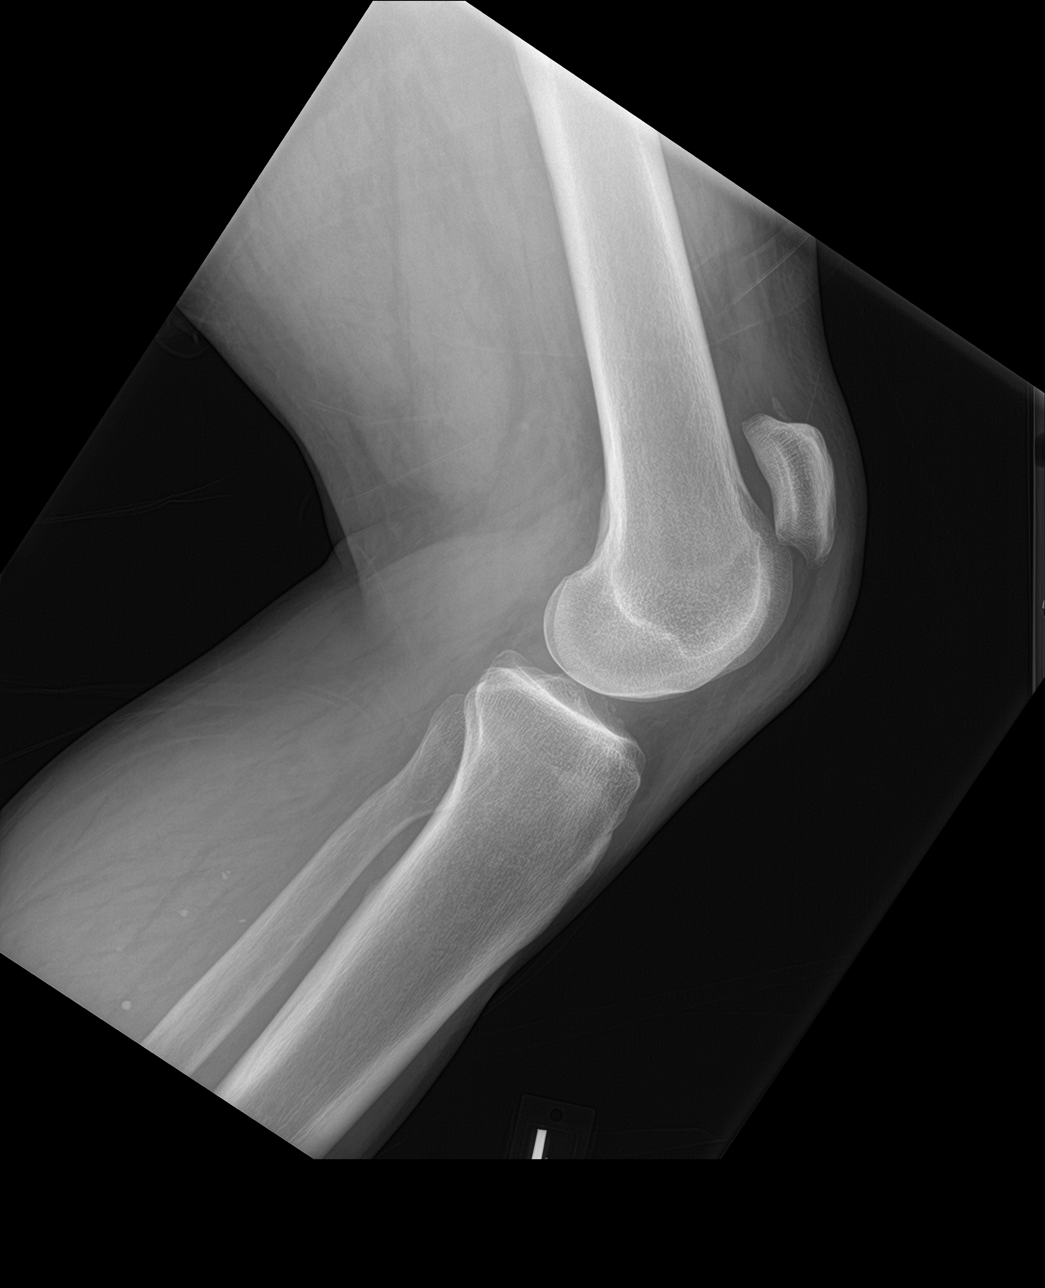

[4 of 4 positions shown; findings below may reference images not displayed]

FINDINGS: There is patellar Alta. Mild soft tissue edema in the region of the
patellar tendon. No evidence of acute fracture. Normal tibiofemoral
alignment. Possible small knee joint effusion.
IMPRESSION: Patella Alta with soft tissue edema in the region of the patellar
tendon. Findings may represent patellar tendon injury. No evidence
of acute fracture.

## 2023-02-18 LAB — HM COLONOSCOPY

## 2023-10-21 ENCOUNTER — Emergency Department (HOSPITAL_COMMUNITY): Payer: Commercial Managed Care - HMO

## 2023-10-21 ENCOUNTER — Other Ambulatory Visit: Payer: Self-pay

## 2023-10-21 ENCOUNTER — Inpatient Hospital Stay (HOSPITAL_COMMUNITY)
Admission: EM | Admit: 2023-10-21 | Discharge: 2023-10-22 | DRG: 193 | Disposition: A | Discharge status: Home / Self Care | Payer: Commercial Managed Care - HMO | Attending: Family Medicine | Admitting: Family Medicine

## 2023-10-21 ENCOUNTER — Inpatient Hospital Stay (HOSPITAL_COMMUNITY): Payer: Commercial Managed Care - HMO

## 2023-10-21 DIAGNOSIS — I270 Primary pulmonary hypertension: Secondary | ICD-10-CM | POA: Diagnosis not present

## 2023-10-21 DIAGNOSIS — H0520 Unspecified exophthalmos: Secondary | ICD-10-CM | POA: Insufficient documentation

## 2023-10-21 DIAGNOSIS — Z7984 Long term (current) use of oral hypoglycemic drugs: Secondary | ICD-10-CM | POA: Diagnosis not present

## 2023-10-21 DIAGNOSIS — K760 Fatty (change of) liver, not elsewhere classified: Secondary | ICD-10-CM | POA: Diagnosis present

## 2023-10-21 DIAGNOSIS — K1370 Unspecified lesions of oral mucosa: Secondary | ICD-10-CM | POA: Diagnosis present

## 2023-10-21 DIAGNOSIS — J9601 Acute respiratory failure with hypoxia: Secondary | ICD-10-CM

## 2023-10-21 DIAGNOSIS — E785 Hyperlipidemia, unspecified: Secondary | ICD-10-CM | POA: Diagnosis present

## 2023-10-21 DIAGNOSIS — E0590 Thyrotoxicosis, unspecified without thyrotoxic crisis or storm: Secondary | ICD-10-CM | POA: Insufficient documentation

## 2023-10-21 DIAGNOSIS — E871 Hypo-osmolality and hyponatremia: Secondary | ICD-10-CM | POA: Diagnosis present

## 2023-10-21 DIAGNOSIS — J188 Other pneumonia, unspecified organism: Secondary | ICD-10-CM

## 2023-10-21 DIAGNOSIS — E119 Type 2 diabetes mellitus without complications: Secondary | ICD-10-CM

## 2023-10-21 DIAGNOSIS — E860 Dehydration: Secondary | ICD-10-CM | POA: Diagnosis present

## 2023-10-21 DIAGNOSIS — R55 Syncope and collapse: Secondary | ICD-10-CM | POA: Diagnosis present

## 2023-10-21 DIAGNOSIS — Z79899 Other long term (current) drug therapy: Secondary | ICD-10-CM

## 2023-10-21 DIAGNOSIS — D696 Thrombocytopenia, unspecified: Secondary | ICD-10-CM | POA: Diagnosis present

## 2023-10-21 DIAGNOSIS — D72810 Lymphocytopenia: Secondary | ICD-10-CM | POA: Diagnosis present

## 2023-10-21 DIAGNOSIS — E1165 Type 2 diabetes mellitus with hyperglycemia: Secondary | ICD-10-CM | POA: Diagnosis present

## 2023-10-21 DIAGNOSIS — J189 Pneumonia, unspecified organism: Secondary | ICD-10-CM | POA: Diagnosis not present

## 2023-10-21 DIAGNOSIS — K7460 Unspecified cirrhosis of liver: Secondary | ICD-10-CM | POA: Diagnosis not present

## 2023-10-21 DIAGNOSIS — Z1152 Encounter for screening for COVID-19: Secondary | ICD-10-CM

## 2023-10-21 DIAGNOSIS — R7989 Other specified abnormal findings of blood chemistry: Secondary | ICD-10-CM | POA: Insufficient documentation

## 2023-10-21 DIAGNOSIS — D72819 Decreased white blood cell count, unspecified: Secondary | ICD-10-CM | POA: Insufficient documentation

## 2023-10-21 DIAGNOSIS — Z87891 Personal history of nicotine dependence: Secondary | ICD-10-CM | POA: Diagnosis not present

## 2023-10-21 DIAGNOSIS — J1000 Influenza due to other identified influenza virus with unspecified type of pneumonia: Secondary | ICD-10-CM | POA: Diagnosis present

## 2023-10-21 DIAGNOSIS — N179 Acute kidney failure, unspecified: Secondary | ICD-10-CM | POA: Diagnosis present

## 2023-10-21 DIAGNOSIS — J101 Influenza due to other identified influenza virus with other respiratory manifestations: Secondary | ICD-10-CM | POA: Insufficient documentation

## 2023-10-21 LAB — GLUCOSE, CAPILLARY
Glucose-Capillary: 464 mg/dL — ABNORMAL HIGH (ref 70–99)
Glucose-Capillary: 449 mg/dL — ABNORMAL HIGH (ref 70–99)
Glucose-Capillary: 414 mg/dL — ABNORMAL HIGH (ref 70–99)
Glucose-Capillary: 401 mg/dL — ABNORMAL HIGH (ref 70–99)

## 2023-10-21 LAB — GLUCOSE, RANDOM: Glucose, Bld: 497 mg/dL — ABNORMAL HIGH (ref 70–99)

## 2023-10-21 LAB — RESP PANEL BY RT-PCR (RSV, FLU A&B, COVID)  RVPGX2
Influenza A by PCR: POSITIVE — AB
Influenza B by PCR: NEGATIVE
Resp Syncytial Virus by PCR: NEGATIVE
SARS Coronavirus 2 by RT PCR: NEGATIVE

## 2023-10-21 LAB — CBC WITH DIFFERENTIAL/PLATELET
Abs Immature Granulocytes: 0.01 10*3/uL (ref 0.00–0.07)
Abs Immature Granulocytes: 0.01 10*3/uL (ref 0.00–0.07)
Basophils Absolute: 0 10*3/uL (ref 0.0–0.1)
Basophils Absolute: 0 10*3/uL (ref 0.0–0.1)
Basophils Relative: 0 %
Basophils Relative: 0 %
Eosinophils Absolute: 0 10*3/uL (ref 0.0–0.5)
Eosinophils Absolute: 0 10*3/uL (ref 0.0–0.5)
Eosinophils Relative: 0 %
Eosinophils Relative: 0 %
HCT: 41.5 % (ref 39.0–52.0)
HCT: 38.7 % — ABNORMAL LOW (ref 39.0–52.0)
Hemoglobin: 13.9 g/dL (ref 13.0–17.0)
Hemoglobin: 12.9 g/dL — ABNORMAL LOW (ref 13.0–17.0)
Immature Granulocytes: 0 %
Immature Granulocytes: 0 %
Lymphocytes Relative: 19 %
Lymphocytes Relative: 10 %
Lymphs Abs: 0.5 10*3/uL — ABNORMAL LOW (ref 0.7–4.0)
Lymphs Abs: 0.3 10*3/uL — ABNORMAL LOW (ref 0.7–4.0)
MCH: 30.5 pg (ref 26.0–34.0)
MCH: 30.9 pg (ref 26.0–34.0)
MCHC: 33.5 g/dL (ref 30.0–36.0)
MCHC: 33.3 g/dL (ref 30.0–36.0)
MCV: 91 fL (ref 80.0–100.0)
MCV: 92.6 fL (ref 80.0–100.0)
Monocytes Absolute: 0.3 10*3/uL (ref 0.1–1.0)
Monocytes Absolute: 0.1 10*3/uL (ref 0.1–1.0)
Monocytes Relative: 11 %
Monocytes Relative: 4 %
Neutro Abs: 1.9 10*3/uL (ref 1.7–7.7)
Neutro Abs: 2.4 10*3/uL (ref 1.7–7.7)
Neutrophils Relative %: 70 %
Neutrophils Relative %: 86 %
Platelets: 102 10*3/uL — ABNORMAL LOW (ref 150–400)
Platelets: 117 10*3/uL — ABNORMAL LOW (ref 150–400)
RBC: 4.56 MIL/uL (ref 4.22–5.81)
RBC: 4.18 MIL/uL — ABNORMAL LOW (ref 4.22–5.81)
RDW: 11.5 % (ref 11.5–15.5)
RDW: 11.7 % (ref 11.5–15.5)
WBC: 2.7 10*3/uL — ABNORMAL LOW (ref 4.0–10.5)
WBC: 2.8 10*3/uL — ABNORMAL LOW (ref 4.0–10.5)
nRBC: 0 % (ref 0.0–0.2)
nRBC: 0 % (ref 0.0–0.2)

## 2023-10-21 LAB — COMPREHENSIVE METABOLIC PANEL
ALT: 58 U/L — ABNORMAL HIGH (ref 0–44)
ALT: 58 U/L — ABNORMAL HIGH (ref 0–44)
AST: 136 U/L — ABNORMAL HIGH (ref 15–41)
AST: 114 U/L — ABNORMAL HIGH (ref 15–41)
Albumin: 3.3 g/dL — ABNORMAL LOW (ref 3.5–5.0)
Albumin: 3 g/dL — ABNORMAL LOW (ref 3.5–5.0)
Alkaline Phosphatase: 94 U/L (ref 38–126)
Alkaline Phosphatase: 88 U/L (ref 38–126)
Anion gap: 11 (ref 5–15)
Anion gap: 14 (ref 5–15)
BUN: 13 mg/dL (ref 6–20)
BUN: 15 mg/dL (ref 6–20)
CO2: 25 mmol/L (ref 22–32)
CO2: 21 mmol/L — ABNORMAL LOW (ref 22–32)
Calcium: 8.5 mg/dL — ABNORMAL LOW (ref 8.9–10.3)
Calcium: 8.3 mg/dL — ABNORMAL LOW (ref 8.9–10.3)
Chloride: 96 mmol/L — ABNORMAL LOW (ref 98–111)
Chloride: 95 mmol/L — ABNORMAL LOW (ref 98–111)
Creatinine, Ser: 1.37 mg/dL — ABNORMAL HIGH (ref 0.61–1.24)
Creatinine, Ser: 1.41 mg/dL — ABNORMAL HIGH (ref 0.61–1.24)
GFR, Estimated: 59 mL/min — ABNORMAL LOW (ref >60)
GFR, Estimated: 57 mL/min — ABNORMAL LOW (ref >60)
Glucose, Bld: 283 mg/dL — ABNORMAL HIGH (ref 70–99)
Glucose, Bld: 513 mg/dL (ref 70–99)
Potassium: 4.7 mmol/L (ref 3.5–5.1)
Potassium: 4.5 mmol/L (ref 3.5–5.1)
Sodium: 132 mmol/L — ABNORMAL LOW (ref 135–145)
Sodium: 130 mmol/L — ABNORMAL LOW (ref 135–145)
Total Bilirubin: 1.4 mg/dL — ABNORMAL HIGH (ref 0.0–1.2)
Total Bilirubin: 1 mg/dL (ref 0.0–1.2)
Total Protein: 6.7 g/dL (ref 6.5–8.1)
Total Protein: 6.4 g/dL — ABNORMAL LOW (ref 6.5–8.1)

## 2023-10-21 LAB — BRAIN NATRIURETIC PEPTIDE: B Natriuretic Peptide: 12 pg/mL (ref 0.0–100.0)

## 2023-10-21 LAB — LIPID PANEL
Cholesterol: 108 mg/dL (ref 0–200)
HDL: 36 mg/dL — ABNORMAL LOW (ref >40)
LDL Cholesterol: 49 mg/dL (ref 0–99)
Total CHOL/HDL Ratio: 3 {ratio}
Triglycerides: 117 mg/dL (ref <150)
VLDL: 23 mg/dL (ref 0–40)

## 2023-10-21 LAB — T4, FREE: Free T4: 1.03 ng/dL (ref 0.61–1.12)

## 2023-10-21 LAB — HEMOGLOBIN A1C
Hgb A1c MFr Bld: 10.5 % — ABNORMAL HIGH (ref 4.8–5.6)
Mean Plasma Glucose: 254.65 mg/dL

## 2023-10-21 LAB — MAGNESIUM: Magnesium: 1.8 mg/dL (ref 1.7–2.4)

## 2023-10-21 LAB — HIV ANTIBODY (ROUTINE TESTING W REFLEX): HIV Screen 4th Generation wRfx: NONREACTIVE

## 2023-10-21 LAB — I-STAT CG4 LACTIC ACID, ED: Lactic Acid, Venous: 1.3 mmol/L (ref 0.5–1.9)

## 2023-10-21 LAB — MRSA NEXT GEN BY PCR, NASAL: MRSA by PCR Next Gen: NOT DETECTED

## 2023-10-21 LAB — TSH: TSH: 0.114 u[IU]/mL — ABNORMAL LOW (ref 0.350–4.500)

## 2023-10-21 MED ORDER — LIDOCAINE 5 % EX PTCH
1.0000 | MEDICATED_PATCH | CUTANEOUS | Status: DC
Start: 2023-10-21 — End: 2023-10-22
  Administered 2023-10-21 – 2023-10-22 (×2): 1 via TRANSDERMAL
  Filled 2023-10-21 (×2): qty 1

## 2023-10-21 MED ORDER — METHYLPREDNISOLONE SODIUM SUCC 125 MG IJ SOLR
125.0000 mg | Freq: Once | INTRAMUSCULAR | Status: AC
  Administered 2023-10-21: 125 mg via INTRAVENOUS
  Filled 2023-10-21: qty 2

## 2023-10-21 MED ORDER — IPRATROPIUM-ALBUTEROL 0.5-2.5 (3) MG/3ML IN SOLN
3.0000 mL | RESPIRATORY_TRACT | Status: AC
  Administered 2023-10-21 (×2): 3 mL via RESPIRATORY_TRACT
  Filled 2023-10-21: qty 6

## 2023-10-21 MED ORDER — SODIUM CHLORIDE 0.9 % IV SOLN
1.0000 g | Freq: Once | INTRAVENOUS | Status: AC
  Administered 2023-10-21: 1 g via INTRAVENOUS
  Filled 2023-10-21: qty 10

## 2023-10-21 MED ORDER — OSELTAMIVIR PHOSPHATE 75 MG PO CAPS
75.0000 mg | ORAL_CAPSULE | Freq: Two times a day (BID) | ORAL | Status: DC
  Administered 2023-10-21 – 2023-10-22 (×2): 75 mg via ORAL
  Filled 2023-10-21 (×3): qty 1

## 2023-10-21 MED ORDER — MAGIC MOUTHWASH: 1.0000 mL | Freq: Three times a day (TID) | ORAL | Status: DC | PRN

## 2023-10-21 MED ORDER — IPRATROPIUM-ALBUTEROL 0.5-2.5 (3) MG/3ML IN SOLN: 3.0000 mL | RESPIRATORY_TRACT | Status: DC | PRN

## 2023-10-21 MED ORDER — LACTATED RINGERS IV BOLUS
1000.0000 mL | Freq: Once | INTRAVENOUS | Status: AC
  Administered 2023-10-21: 1000 mL via INTRAVENOUS

## 2023-10-21 MED ORDER — INSULIN ASPART 100 UNIT/ML IJ SOLN
20.0000 [IU] | Freq: Once | INTRAMUSCULAR | Status: AC
  Administered 2023-10-21: 20 [IU] via SUBCUTANEOUS

## 2023-10-21 MED ORDER — ACETAMINOPHEN 325 MG PO TABS
650.0000 mg | ORAL_TABLET | Freq: Four times a day (QID) | ORAL | Status: DC | PRN
  Administered 2023-10-22: 650 mg via ORAL
  Filled 2023-10-21: qty 2

## 2023-10-21 MED ORDER — IOHEXOL 350 MG/ML SOLN
200.0000 mL | Freq: Once | INTRAVENOUS | Status: AC | PRN
  Administered 2023-10-21: 200 mL via INTRAVENOUS

## 2023-10-21 MED ORDER — ACETAMINOPHEN 325 MG PO TABS
650.0000 mg | ORAL_TABLET | Freq: Once | ORAL | Status: AC
  Administered 2023-10-21: 650 mg via ORAL
  Filled 2023-10-21: qty 2

## 2023-10-21 MED ORDER — INSULIN ASPART 100 UNIT/ML IJ SOLN
0.0000 [IU] | Freq: Three times a day (TID) | INTRAMUSCULAR | Status: DC
  Administered 2023-10-21: 20 [IU] via SUBCUTANEOUS
  Administered 2023-10-22: 7 [IU] via SUBCUTANEOUS
  Administered 2023-10-22: 4 [IU] via SUBCUTANEOUS
  Administered 2023-10-22: 11 [IU] via SUBCUTANEOUS

## 2023-10-21 MED ORDER — ENOXAPARIN SODIUM 40 MG/0.4ML IJ SOSY
40.0000 mg | PREFILLED_SYRINGE | INTRAMUSCULAR | Status: DC
  Administered 2023-10-21: 40 mg via SUBCUTANEOUS
  Filled 2023-10-21: qty 0.4

## 2023-10-21 MED ORDER — ACETAMINOPHEN 650 MG RE SUPP: 650.0000 mg | Freq: Four times a day (QID) | RECTAL | Status: DC | PRN

## 2023-10-21 MED ORDER — SODIUM CHLORIDE 0.9 % IV SOLN
500.0000 mg | Freq: Once | INTRAVENOUS | Status: AC
  Administered 2023-10-21: 500 mg via INTRAVENOUS
  Filled 2023-10-21: qty 5

## 2023-10-21 MED ORDER — ROSUVASTATIN CALCIUM 5 MG PO TABS
10.0000 mg | ORAL_TABLET | Freq: Every day | ORAL | Status: DC
  Administered 2023-10-21 – 2023-10-22 (×2): 10 mg via ORAL
  Filled 2023-10-21 (×2): qty 2

## 2023-10-21 MED ORDER — SODIUM CHLORIDE 0.9% FLUSH
3.0000 mL | Freq: Two times a day (BID) | INTRAVENOUS | Status: DC
  Administered 2023-10-21 – 2023-10-22 (×3): 3 mL via INTRAVENOUS

## 2023-10-21 MED ORDER — IPRATROPIUM-ALBUTEROL 0.5-2.5 (3) MG/3ML IN SOLN
3.0000 mL | RESPIRATORY_TRACT | Status: AC
  Administered 2023-10-21 (×3): 3 mL via RESPIRATORY_TRACT
  Filled 2023-10-21: qty 6
  Filled 2023-10-21: qty 3

## 2023-10-21 NOTE — ED Triage Notes (Addendum)
 Patient diagnosed with Flu 3 days ago at an urgent care , reports persistent fatigue /generalized weakness , chills and occasional cough . Prescribed with Tamiflu Seldon Dago tabs/Phenergan  but did not fill the prescriptions .

## 2023-10-21 NOTE — Assessment & Plan Note (Addendum)
 Has had a syncopal episode in the past for which he has been hospitalized and has become hypoxic on room air previously as well.  No known cardiac issues.  Last echo done in 2015 which revealed EF of 60 to 65% with G1DD.  However during his last hospitalization in 2015 he denied any CP, SOB, orthopnea, or PND. Admits to some orthopnea at this time, but denies PND. - EKG and Echo - Labs: TSH, BNP, Mag - Risk stratification labs: A1c, Lipid panel, HIV - Orthostatic VS

## 2023-10-21 NOTE — Hospital Course (Addendum)
 Keith Larsen is a 63 y.o.male with a history of T2DM who was admitted to the [facility] Medicine Teaching Service at [facility] for multifocal PNA. His hospital course is detailed below:  Multifocal PNA  Influenza A  AHRF Positive for influenza A. CXR revealed concern for multifocal pneumonia which was confirmed with CTA chest. Started on CTX/Azithromycin . Increased oxygen requirement to 3L [place] with diffuse wheezing noted on exam and bibasilar crackles that improved with DuoNebs and Solu-Medrol . Oxygen was weaned to RA by time of discharge and he was transitioned to PO Augmentin  (end 02/10) and Azithromycin  (end 02/08).  Syncope (resolved) Multiple syncopal episodes in the days prior to his admission, likely due to underlying dehydration. Has had his similar presentation in the past (had taken unknown ED medication at that time). EKG in NSR with regular rate.  Echo demonstrated G2DD but no overt cause for his syncope. PT/OT evaluated and signed off.   Lymphopenia  WBC 2.8 with decreased lymphocytes on admission. Likely due to underlying Flu A; however, chart review revealed CT PE in 2015 which demonstrated mediastinal, bilateral hilar, bilateral axillary lymphadenopathy concerning for lymphoma or metastatic disease. However, no follow-up was noted with heme hematology/oncology. CTA chest demonstrated similar findings that are concerning for possible sarcoidosis which could also represent cause of his lymphopenia. Plan for outpatient pulmonology referral.  T2DM A1c 10.5, on glipizide 10 mg daily. CBGs elevated likely secondary to receiving Solu-Medrol  upon admission. Started Semglee  15u inpatient and sugars improved. Eventually discharged on metformin  500mg  every day.   AKI (resolved)  Unknown baseline creatinine, previously 0.96.  Elevated to 1.3-1.4 upon admission, normalized with fluid resuscitation.  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 He has  otherwise been asymptomatic from this standpoint. TSH low at 0.114 and T4 normal, outpatient follow up  Elevated LFTs  Cirrhosis  AST 114 and ALT 58 on admission. CTA chest captured marked capsular nodularity consistent with cirrhosis, likely in the setting of alcohol use disorder. Hepatitis panel negative and MELD score 11.   Oral Lesion Noted chronic white mucosal lesion on the floor of his mouth on the right side beneath tongue. Endorsed history of smoking cigars and that the lesion has been growing over time. Possible LAD throughout anterior neck vs prominent SCM muscle. Continue to monitor  Other chronic conditions were medically managed with home medications and formulary alternatives as necessary: HLD: Continue Crestor  10 mg daily.  Lipid panel normal with low HDL.   PCP Follow-up Recommendations: Consider ENT follow up outpatient for oral lesion.  Consider further workup for his possible hyperthyroidism (TSH low, T4 normal).  Titrate LAI and diabetes regimen as sugars indicate. Consider outpatient pulmonary consult to work up chronic lymphadenopathy for possible sarcoidosis Consider outpatient GI follow up for further cirrhosis workup  Consider repeat lab work such as CBC, CMP to monitor lymphopenia, liver function, renal function

## 2023-10-21 NOTE — Assessment & Plan Note (Addendum)
 Proptosis noted on exam and with hx of diaphoresis and heat intolerance leading to syncopal episodes. TSH 0.114.  - T4 ordered - consider US  for further eval

## 2023-10-21 NOTE — Plan of Care (Signed)
 FMTS Brief Progress Note  S: Seen on night rounds with Dr. Elicia and Dr. Rollene. Patient reports feeling much better than earlier with decreased shortness of breath. Does have some right sided rib pain with coughing. Patient does not recall ever hearing about lymph nodes on prior imaging. Reports he had a hernia surgery and that was why he had imaging done.    O: BP (!) 141/83 (BP Location: Left Arm)   Pulse 89   Temp 98.4 F (36.9 C) (Oral)   Resp 18   Ht 6' (1.829 m)   Wt 123.3 kg   SpO2 91%   BMI 36.87 kg/m   General: A&O, NAD HEENT: No sign of trauma, EOM grossly intact, white plaque on right lateral side of tongue with mucosal bumps under tongue. No LDA noted  Cardiac: RRR, no m/r/g Respiratory: mild end expiratory wheezing diffusely, normal WOB on RA  GI: Soft, NTTP, non-distended Extremities: NTTP, no peripheral edema. Psych: Appropriate mood and affect   A/P: 63 yo M w male admitted for dyspnea and syncopal episodes. Patient reports feeling better since admission.  Multifocal pneumonia Patient complaining of left rib pain with coughing. Ordered lidocaine  patch. - rest of plan per day team   Leukopenia/Incidental Lymphadenopathy Given patient does not recall any work up for prior lymphadenopathy noted on imaging in 2015, will order CT.  - Orders reviewed. Labs for AM ordered, which was adjusted as needed.  - If condition changes, plan includes reevaluation and will make adjustments as needed.   Hyperglycemia Patient with CBGs in 400s. Will treat with novolog  prn and consider repeat BMP to investigate anion gap.  - frequent cbg checks - rSSI - consider endotool if patient does have anion gap metabolic acidosis   White plaque in mouth - magic mouthwash prn     Lonnie, Ruhan Borak, MD 10/21/2023, 8:46 PM PGY-1, [facility] Night Resident  Please page [phone] with questions.

## 2023-10-21 NOTE — Assessment & Plan Note (Addendum)
 CXR findings with findings consistent with the same; Likely superimposed bacterial pneumonia 2/2 Flu A.New oxygen requirement with increased wheezing and bibasilar crackles on exam.  S/p DuoNebs, Solumedrol, CTX and Azithromycin .  - Admit to FMTS, attending Dr. Donah - Med-Tele, Vital signs per floor - PT/OT to treat - Fall precautions - Continue antibiotics: CTX (2/6-2/10) and Azithromycin  (2/6-2/8) - Repeat CBC w/ diff and CMP -- possibly hemolyzed - AM CBC, BMP - MRSA swab - Another dose of DuoNeb today. Ordered DuoNeb q4h PRN

## 2023-10-21 NOTE — Assessment & Plan Note (Addendum)
 A1c 10.5, not controlled on Glipizide 10 mg daily. CBGs likely elevated at this time 2/2 receiving Solumedrol, most recent glucose 513.  - resistant SSI - Monitor CBGs - Add on insulin  based on SSI - LR bolus

## 2023-10-21 NOTE — ED Provider Notes (Signed)
 Keith Larsen Provider Note   CSN: [id number] Arrival date & time: 10/21/23  9395     History  Chief Complaint  Patient presents with   Flu Symptoms     Cough / Fatigue Keith Larsen    Keith Larsen is a 64 y.o. male.  Patient with history of diabetes presents today with complaints of cough, shortness of breath, syncope. He states that he started feeling unwell on Saturday with cough, congestion, and bodyaches. Began feeling weak and lightheaded in the following few days, went to urgent care on 2/3 and was diagnosed with the flu and was prescribed tamiflu , tessalon, and promethazine  cough syrup. He did not fill these prescriptions. In the last 3 days he states that he has had several syncopal episodes. He states that when this occurs he feels weak and lightheaded and then looses consciousness. He denies injury from these episodes. Notes that he is short of breath, denies any chest pain. He is not on oxygen at baseline. He does not smoke and denies history of similar symptoms previously.   The history is provided by the patient. No language interpreter was used.       Home Medications Prior to Admission medications   Medication Sig Start Date End Date Taking? Authorizing Provider  APPLE CIDER VINEGAR PO Take 2 tablets by mouth 2 (two) times daily.    [provider]  ibuprofen (ADVIL,MOTRIN) 200 MG tablet Take 600 mg by mouth every 6 (six) hours as needed for headache, mild pain or moderate pain.     [provider]  metFORMIN  (GLUCOPHAGE ) 500 MG tablet Take 1,000 mg by mouth 2 (two) times a week.    [provider]  Multiple Vitamins-Minerals (MULTIVITAMIN WITH MINERALS) tablet Take 1 tablet by mouth daily. Prelox    [provider]  naphazoline-pheniramine (VISINE) 0.025-0.3 % ophthalmic solution Place 1 drop into both eyes daily.    [provider]  OVER THE COUNTER MEDICATION Take 1 tablet by  mouth 3 (three) times a week. Sweet eze    [provider]      Allergies    Patient has no known allergies.    Review of Systems   Review of Systems  Respiratory:  Positive for shortness of breath.   All other systems reviewed and are negative.   Physical Exam Updated Vital Signs BP (!) 142/85 (BP Location: Right Arm)   Pulse 92   Temp (!) 100.7 F (38.2 C)   Resp 18   SpO2 92%  Physical Exam Vitals and nursing note reviewed.  Constitutional:      General: He is not in acute distress.    Appearance: Normal appearance. He is normal weight. He is not ill-appearing, toxic-appearing or diaphoretic.  HENT:     Head: Normocephalic and atraumatic.  Cardiovascular:     Rate and Rhythm: Normal rate and regular rhythm.     Heart sounds: Normal heart sounds.  Pulmonary:     Effort: Tachypnea present. No respiratory distress.     Breath sounds: Wheezing and rales present.     Comments: [place] 2L [place]2 [place] Abdominal:     General: Abdomen is flat.     Palpations: Abdomen is soft.     Tenderness: There is no abdominal tenderness.  Musculoskeletal:        General: Normal range of motion.     Cervical back: Normal range of motion.  Skin:    General: Skin is  warm and dry.  Neurological:     General: No focal deficit present.     Mental Status: He is alert.  Psychiatric:        Mood and Affect: Mood normal.        Behavior: Behavior normal.     ED Results / Procedures / Treatments   Labs (all labs ordered are listed, but only abnormal results are displayed) Labs Reviewed  RESP PANEL BY RT-PCR (RSV, FLU A&B, COVID)  RVPGX2 - Abnormal; Notable for the following components:      Result Value   Influenza A by PCR POSITIVE (*)    All other components within normal limits  CBC WITH DIFFERENTIAL/PLATELET - Abnormal; Notable for the following components:   WBC 2.7 (*)    Platelets 102 (*)    Lymphs Abs 0.5 (*)    All other components within normal limits  COMPREHENSIVE  METABOLIC PANEL - Abnormal; Notable for the following components:   Sodium 132 (*)    Chloride 96 (*)    Glucose, Bld 283 (*)    Creatinine, Ser 1.37 (*)    Calcium  8.5 (*)    Albumin 3.3 (*)    AST 136 (*)    ALT 58 (*)    Total Bilirubin 1.4 (*)    GFR, Estimated 59 (*)    All other components within normal limits  CULTURE, BLOOD (ROUTINE X 2)  CULTURE, BLOOD (ROUTINE X 2)  I-STAT CG4 LACTIC ACID, ED    EKG None  Radiology DG Chest 2 View Result Date: 10/21/2023 CLINICAL DATA:  Cough.  Right flank pain. EXAM: CHEST - 2 VIEW COMPARISON:  CT angio chest 08/05/2014 FINDINGS: The heart is enlarged. Bibasilar airspace opacities are present. Patchy airspace opacities are present in the right upper lobe as well. Visualized soft tissues and bony thorax are unremarkable. IMPRESSION: 1. Bibasilar and right upper lobe airspace disease concerning for multifocal pneumonia. 2. Cardiomegaly without failure. Electronically Signed   By: Lonni Necessary M.D.   On: 10/21/2023 07:28    Procedures .Critical Care  Performed by: Nora Lauraine LABOR, PA-C Authorized by: Abbye Lao A, PA-C   Critical care provider statement:    Critical care time (minutes):  35   Critical care was necessary to treat or prevent imminent or life-threatening deterioration of the following conditions:  Respiratory failure   Critical care was time spent personally by me on the following activities:  Development of treatment plan with patient or surrogate, discussions with primary provider, evaluation of patient's response to treatment, examination of patient, obtaining history from patient or surrogate, ordering and review of laboratory studies, ordering and review of radiographic studies, pulse oximetry, re-evaluation of patient's condition and review of old charts   Care discussed with: admitting provider       Medications Ordered in ED Medications  ipratropium-albuterol  (DUONEB) 0.5-2.5 (3) MG/3ML nebulizer solution  3 mL (has no administration in time range)  acetaminophen  (TYLENOL ) tablet 650 mg (has no administration in time range)  cefTRIAXone  (ROCEPHIN ) 1 g in sodium chloride  0.9 % 100 mL IVPB (has no administration in time range)  azithromycin  (ZITHROMAX ) 500 mg in sodium chloride  0.9 % 250 mL IVPB (has no administration in time range)  methylPREDNISolone  sodium succinate (SOLU-MEDROL ) 125 mg/2 mL injection 125 mg (has no administration in time range)    ED Course/ Medical Decision Making/ A&P  Medical Decision Making Amount and/or Complexity of Data Reviewed Labs: ordered. Radiology: ordered.  Risk OTC drugs. Prescription drug management.   This patient is a 63 y.o. male who presents to the ED for concern of shortness of breath, this involves an extensive number of treatment options, and is a complaint that carries with it a high risk of complications and morbidity. The emergent differential diagnosis prior to evaluation includes, but is not limited to,  URI, CHF, pericardial effusion/tamponade, arrhythmias, ACS, COPD, asthma, bronchitis, pneumonia, pneumothorax, PE, anemia   This is not an exhaustive differential.   Past Medical History / Co-morbidities / Social History:  has a past medical history of Diabetes mellitus without complication (HCC).  Additional history: Chart reviewed. Pertinent results include: Seen in urgent care on 2/03, flu positive at that time.  Given Tessalon, Tamiflu , and promethazine  cough syrup  Physical Exam: Physical exam performed. The pertinent findings include: Lung sounds with wheezing throughout and rales in lower lobes.  Tachypnea present, patient on 2 L of oxygen.  Lab Tests: I ordered, and personally interpreted labs.  The pertinent results include:  WBC 2.7, platelets 102, Na 132, chloride 96, glucose 283, creatinine 1.37. Last labs I can see were drawn 5 years ago. Lactic WNL   Imaging Studies: I ordered imaging  studies including CXR. I independently visualized and interpreted imaging which showed   1. Bibasilar and right upper lobe airspace disease concerning for multifocal pneumonia. 2. Cardiomegaly without failure.  I agree with the radiologist interpretation.   Medications: I ordered medication including DuoNeb x 3, Solu-Medrol , azithromycin , Rocephin  for wheezing, multifocal pneumonia. Reevaluation of the patient after these medicines showed that the patient improved. I have reviewed the patients home medicines and have made adjustments as needed.   Disposition: After consideration of the diagnostic results and the patients response to treatment, I feel that patient will require admission for multifocal pneumonia with hypoxic respiratory failure with new oxygen requirement.  Discussed with patient is understanding and agreement.   Discussed patient with hospitalist who accepts patient for admission.   I discussed this case with my attending physician Dr. Randol who cosigned this note including patient's presenting symptoms, physical exam, and planned diagnostics and interventions. Attending physician stated agreement with plan or made changes to plan which were implemented.    Final Clinical Impression(s) / ED Diagnoses Final diagnoses:  Multifocal pneumonia  Acute respiratory failure with hypoxia [facility] Larsen)    Rx / DC Orders ED Discharge Orders     None         Nora Lauraine DELENA DEVONNA 10/21/23 1343    Randol Simmonds, MD 10/22/23 [phone]

## 2023-10-21 NOTE — Assessment & Plan Note (Addendum)
 Unknown baseline since seems to not have had great follow up per chart review. Cr 5 years ago at 0.96, elevated to 1.3-1.4 today.  Likely has CKD secondary to uncontrolled diabetes. - 1L LR bolus - Monitor with AM BMP

## 2023-10-21 NOTE — Assessment & Plan Note (Addendum)
 Tested positive for Flu A. Febrile to 100.7 in the ED.  - Tamiflu  - Supportive care - Tylenol  PRN - Droplet and Contact precautions

## 2023-10-21 NOTE — Assessment & Plan Note (Addendum)
 WBC 2.8, Plt 117. Upon chart review CTPE from 2015 demonstrated mediastinal, bilateral hilar, and bilateral axillary lymphadenopathy, concerning for lymphoma or metastatic disease. Doesn't seems like he followed up with heme/onc as recommended.  - Consider repeat CT PE

## 2023-10-21 NOTE — ED Notes (Signed)
 Pt's Sa02 is 87% on RA. Placed pt on 3L 02 per [place]

## 2023-10-21 NOTE — ED Notes (Signed)
 Phlebotomy to grab second set of blood cultures.

## 2023-10-21 NOTE — ED Notes (Signed)
 Pt off floor with transport in no new onset distress at this time.

## 2023-10-21 NOTE — Assessment & Plan Note (Addendum)
 AST 114 and ALT 58.  No abdominal TTP.  No history of alcohol abuse. Possibly due to influenza w/ superimposed PNA d/t hypoxia and immune response related to hepatocellular injury. - Monitor LFTs - Consider RUQ US 

## 2023-10-21 NOTE — H&P (Addendum)
 Hospital Admission History and Physical Service Pager: [phone]  Patient name: Keith Larsen Medical record number: [id number] Date of Birth: [birth year] Age: 63 y.o. Gender: male  Primary Care Provider: Darra Hamilton, PA-C Consultants: None Code Status: Full code Preferred Emergency Contact:  Contact Information     Name Relation Home Work Mobile   Billig,Annette Spouse [phone]  [phone]   Chief Complaint: Flu-like symptoms and syncope  Assessment and Plan: Keith Larsen is a 63 y.o. male presenting with cough, dyspnea, fever, and syncopal episodes. Differential for presentation of this includes:  Dyspnea URI  Influenza A: Tested positive on RVP for flu A.  Has constitutional flu symptoms.  Was febrile in the ED to 100.7. Superimposed pneumonia: Likely secondary to influenza A in the setting of fever, dyspnea, and cough.  CXR revealed concern for multifocal pneumonia and bibasilar and RUL. PE: Possible due to dyspnea and mild tachycardia.  Consider CT PE if no improvement. New onset HF: Echo in 2015 revealed preserved EF with G1DD. Ordered BNP and will repeat echo. Pleural effusion: Possibly due to his symptoms and blunting of costophrenic angles.  Pneumothorax: Unlikely in the absence chest pain Asthma exacerbation: No hx of asthma. However presented with dyspnea and wheezing on exam.   Syncope Dehydration: Possible 2/2 URI symptoms and having the flu Vasovagal: Possible due to constellation of symptoms prior to his episodes (feeling warm and diaphoretic). Normally happens while at rest.  Orthostatic hypotension: Most of his episodes were while sitting or laying so unlikely, but will obtain orthostatic VS. Arrhythmias: Possible although tele demonstrated NSR with some PVCs in room. He has no complaints of chest pain. Hypoglycemia 2/2 Glipizide: Only medication he takes for his T2DM with the most common side effect being hypoglycemia. Assessment &  Plan Multifocal pneumonia CXR findings with findings consistent with the same; Likely superimposed bacterial pneumonia 2/2 Flu A.New oxygen requirement with increased wheezing and bibasilar crackles on exam.  S/p DuoNebs, Solumedrol, CTX and Azithromycin .  - Admit to FMTS, attending Dr. Donah - Med-Tele, Vital signs per floor - PT/OT to treat - Fall precautions - Continue antibiotics: CTX (2/6-2/10) and Azithromycin  (2/6-2/8) - Repeat CBC w/ diff and CMP -- possibly hemolyzed - AM CBC, BMP - MRSA swab - Another dose of DuoNeb today. Ordered DuoNeb q4h PRN Influenza A Tested positive for Flu A. Febrile to 100.7 in the ED.  - Tamiflu  - Supportive care - Tylenol  PRN - Droplet and Contact precautions Syncope Has had a syncopal episode in the past for which he has been hospitalized and has become hypoxic on room air previously as well.  No known cardiac issues.  Last echo done in 2015 which revealed EF of 60 to 65% with G1DD.  However during his last hospitalization in 2015 he denied any CP, SOB, orthopnea, or PND. Admits to some orthopnea at this time, but denies PND. - EKG and Echo - Labs: TSH, BNP, Mag - Risk stratification labs: A1c, Lipid panel, HIV - Orthostatic VS Leukopenia WBC 2.8, Plt 117. Upon chart review CTPE from 2015 demonstrated mediastinal, bilateral hilar, and bilateral axillary lymphadenopathy, concerning for lymphoma or metastatic disease. Doesn't seems like he followed up with heme/onc as recommended.  - Consider repeat CT PE Type 2 diabetes mellitus (HCC) A1c 10.5, not controlled on Glipizide 10 mg daily. CBGs likely elevated at this time 2/2 receiving Solumedrol, most recent glucose 513.  - resistant SSI - Monitor CBGs - Add on insulin  based on SSI - LR bolus AKI (  acute kidney injury) (HCC) Unknown baseline since seems to not have had great follow up per chart review. Cr 5 years ago at 0.96, elevated to 1.3-1.4 today.  Likely has CKD secondary to uncontrolled  diabetes. - 1L LR bolus - Monitor with AM BMP Elevated LFTs AST 114 and ALT 58.  No abdominal TTP.  No history of alcohol abuse. Possibly due to influenza w/ superimposed PNA d/t hypoxia and immune response related to hepatocellular injury. - Monitor LFTs - Consider RUQ US  Hyperthyroidism Proptosis noted on exam and with hx of diaphoresis and heat intolerance leading to syncopal episodes. TSH 0.114.  - T4 ordered - consider US  for further eval  Chronic and Stable Problems: HLD: Continue Crestor  10 mg daily.  Lipid panel normal with low HDL.   FEN/GI: Carb Modified Diet VTE Prophylaxis: Lovenox   Disposition: Med-Tele  History of Present Illness:  Keith Larsen is a 63 y.o. male presenting with cough, SOB, and syncopal episode.  Started feeling unwell on Saturday with cough, congestion, and body aches. Went to an UC and was diagnosed with the Flu on 2/3.  He was prescribed Tamiflu , Tessalon Perles, and promethazine  cough syrup.  However he did not fill these prescriptions.  Over the last couple of days he has had several syncopal episodes, most recently this morning which prompted his ED visit. These episodes are usually preceded by sweats and feeling hot. Some nausea as well, but no vomiting. Episodes usually occur when he is at rest (laying in bed, at the dinner table, watching TV).   He reports feeling weak and lightheaded and denies any LOC or head strike.  Wife corroborates the story and shares that during these episodes he seems to lose consciousness for couple seconds, but has not fallen.  Denies any seizure activity or postictal state, or confusion.  Denies any injuries from these episodes.  Continues to have shortness of breath, however denies any chest pain.  Does not require oxygen at baseline, however is on 3 L [place] currently due to desatting to 85% on RA.  Currently reports some improvement in his cough although still persistent. Some associated abdominal pain due to cough. Had  diarrhea last night.  Endorses orthopnea. Does not use oxygen at home  Denies chest pain, headaches, floaters/flashing lights/blindness, vomiting, dysuria, leg swelling  In the ED, patient presented with flulike symptoms.  Vitals revealed patient was febrile to 100.7 in the ED, desatted to 88% on RA, is tachypneic, and mildly tachycardic.  Recently diagnosed with flu at an urgent care on 2/3.  Repeat RVP today revealed positive for flu A.  Lactic acid negative at 1.3.  CBC demonstrates leukopenia 2.8, platelets  and stable hemoglobin at 13.9.  CMP with hyponatremia corrected for hyperglycemia is 137, glucose elevated likely in the setting of getting solumedrol, Cr 1.41, and AST/ALT 114/58, with slightly elevated AG 14. CXR revealed bibasilar and RUL airspace disease concerning for multifocal pneumonia and cardiomegaly without failure.  Blood cultures were drawn.  Patient received DuoNebs x 3, Solu-Medrol  125 mg, Tylenol  650 mg, ceftriaxone  1 g, and azithromycin  500 mg.  Review Of Systems: As above  Pertinent Past Medical History: T2DM Remainder reviewed in history tab.   Pertinent Past Surgical History: Umbilical hernia repair Knee surgery Remainder reviewed in history tab.   Pertinent Social History: Tobacco use: Occasionally smokes cigars Alcohol use: No Other Substance use: No Lives with Wife  Pertinent Family History: None Remainder reviewed in history tab.   Important Outpatient Medications: Glipizide  10 mg daily Ibuprofen 600 mg q6h PRN MVI Crestor  10 mg daily Remainder reviewed in medication history.   Objective: BP (!) 146/80 (BP Location: Left Arm)   Pulse 91   Temp 97.7 F (36.5 C)   Resp 20   Ht 6' (1.829 m)   Wt 123.3 kg   SpO2 99%   BMI 36.87 kg/m  Exam: General: Awake and Alert in NAD HEENT: NCAT. Sclera anicteric. Proptosis.  Cardiovascular: RRR. No M/R/G Respiratory: Wheezes auscultated diffusely. Bibasilar crackles present. Normal WOB on 3L  [place]. Abdomen: Soft, non-tender, non-distended. Bowel sounds normoactive Extremities: Able to move all extremities equally. No BLE edema, no deformities or significant joint findings. Skin: Warm and dry. Neuro: A&Ox4. No focal neurological deficits. CN II: PERRL CN III, IV,VI: EOMI CV V: Normal sensation in V1, V2, V3 CVII: Symmetric smile and brow raise CN VIII: Normal hearing CN IX,X: Symmetric palate raise  CN XI: 5/5 shoulder shrug CN XII: Symmetric tongue protrusion  UE and LE strength 5/5 Normal sensation in UE and LE bilaterally   Labs:  CBC BMET  Recent Labs  Lab 10/21/23 1604  WBC 2.8*  HGB 12.9*  HCT 38.7*  PLT 117*   Recent Labs  Lab 10/21/23 1604  NA 130*  K 4.5  CL 95*  CO2 21*  BUN 15  CREATININE 1.41*  GLUCOSE 513*  CALCIUM  8.3*    RVP: + Influenza A  EKG: My Interpretation: NSR. Regular rate 88. Mild Qtc prolongation to 451. No ST elevation. Normal axis.   Imaging Studies Performed: CXR 1. Bibasilar and right upper lobe airspace disease concerning for multifocal pneumonia. 2. Cardiomegaly without failure.  Janna Ferrier, DO 10/21/2023, 5:38 PM PGY-1, [facility]  FPTS Intern pager: [phone], text pages welcome Secure chat group [facility] Teaching Service    FPTS Upper-Level Resident Addendum   I have independently interviewed and examined the patient. I have discussed the above with Dr. Janna and agree with the documented plan. My edits for correction/addition/clarification are included above. Please see any attending notes.   Payton Coward, MD PGY-2, [facility] 10/21/2023 5:42 PM  FPTS Service pager: [phone] (text pages welcome through [facility])

## 2023-10-21 NOTE — ED Notes (Signed)
 Pt is on 5L 02 per [place] and has Sa02 97%

## 2023-10-22 ENCOUNTER — Other Ambulatory Visit (HOSPITAL_COMMUNITY): Payer: Self-pay

## 2023-10-22 ENCOUNTER — Telehealth (HOSPITAL_COMMUNITY): Payer: Self-pay | Admitting: Pharmacy Technician

## 2023-10-22 ENCOUNTER — Inpatient Hospital Stay (HOSPITAL_COMMUNITY): Payer: Commercial Managed Care - HMO

## 2023-10-22 DIAGNOSIS — J9601 Acute respiratory failure with hypoxia: Secondary | ICD-10-CM | POA: Diagnosis not present

## 2023-10-22 DIAGNOSIS — K7460 Unspecified cirrhosis of liver: Secondary | ICD-10-CM | POA: Diagnosis not present

## 2023-10-22 DIAGNOSIS — R55 Syncope and collapse: Secondary | ICD-10-CM | POA: Diagnosis not present

## 2023-10-22 DIAGNOSIS — J189 Pneumonia, unspecified organism: Secondary | ICD-10-CM | POA: Diagnosis not present

## 2023-10-22 DIAGNOSIS — I270 Primary pulmonary hypertension: Secondary | ICD-10-CM | POA: Diagnosis not present

## 2023-10-22 LAB — CBC
HCT: 37.4 % — ABNORMAL LOW (ref 39.0–52.0)
Hemoglobin: 12.8 g/dL — ABNORMAL LOW (ref 13.0–17.0)
MCH: 30.7 pg (ref 26.0–34.0)
MCHC: 34.2 g/dL (ref 30.0–36.0)
MCV: 89.7 fL (ref 80.0–100.0)
Platelets: 104 10*3/uL — ABNORMAL LOW (ref 150–400)
RBC: 4.17 MIL/uL — ABNORMAL LOW (ref 4.22–5.81)
RDW: 11.5 % (ref 11.5–15.5)
WBC: 2.4 10*3/uL — ABNORMAL LOW (ref 4.0–10.5)
nRBC: 0 % (ref 0.0–0.2)

## 2023-10-22 LAB — ECHOCARDIOGRAM COMPLETE
AR max vel: 2.97 cm2
AV Area VTI: 2.92 cm2
AV Area mean vel: 2.72 cm2
AV Mean grad: 5 mm[Hg]
AV Peak grad: 8 mm[Hg]
Ao pk vel: 1.41 m/s
Area-P 1/2: 4.06 cm2
Calc EF: 68.4 %
Height: 72 in
MV VTI: 2.69 cm2
S' Lateral: 3.5 cm
Single Plane A2C EF: 63.9 %
Single Plane A4C EF: 71.6 %
Weight: 4349.23 [oz_av]

## 2023-10-22 LAB — GLUCOSE, CAPILLARY
Glucose-Capillary: 174 mg/dL — ABNORMAL HIGH (ref 70–99)
Glucose-Capillary: 160 mg/dL — ABNORMAL HIGH (ref 70–99)
Glucose-Capillary: 257 mg/dL — ABNORMAL HIGH (ref 70–99)
Glucose-Capillary: 228 mg/dL — ABNORMAL HIGH (ref 70–99)

## 2023-10-22 LAB — BASIC METABOLIC PANEL
Anion gap: 9 (ref 5–15)
BUN: 16 mg/dL (ref 6–20)
CO2: 24 mmol/L (ref 22–32)
Calcium: 8.8 mg/dL — ABNORMAL LOW (ref 8.9–10.3)
Chloride: 101 mmol/L (ref 98–111)
Creatinine, Ser: 1.18 mg/dL (ref 0.61–1.24)
GFR, Estimated: >60 mL/min (ref >60)
Glucose, Bld: 367 mg/dL — ABNORMAL HIGH (ref 70–99)
Potassium: 4.3 mmol/L (ref 3.5–5.1)
Sodium: 134 mmol/L — ABNORMAL LOW (ref 135–145)

## 2023-10-22 LAB — PROTIME-INR
INR: 1 (ref 0.8–1.2)
Prothrombin Time: 12.9 s (ref 11.4–15.2)

## 2023-10-22 LAB — HEPATITIS PANEL, ACUTE
HCV Ab: NONREACTIVE
Hep A IgM: NONREACTIVE
Hep B C IgM: NONREACTIVE
Hepatitis B Surface Ag: NONREACTIVE

## 2023-10-22 LAB — BETA-HYDROXYBUTYRIC ACID: Beta-Hydroxybutyric Acid: 0.16 mmol/L (ref 0.05–0.27)

## 2023-10-22 MED ORDER — INSULIN STARTER KIT- PEN NEEDLES (ENGLISH)
1.0000 | Freq: Once | Status: DC
  Filled 2023-10-22: qty 1

## 2023-10-22 MED ORDER — AMOXICILLIN-POT CLAVULANATE 875-125 MG PO TABS: 1.0000 | ORAL_TABLET | Freq: Two times a day (BID) | ORAL | Status: DC

## 2023-10-22 MED ORDER — SODIUM CHLORIDE 0.9 % IV SOLN
500.0000 mg | INTRAVENOUS | Status: DC
  Administered 2023-10-22: 500 mg via INTRAVENOUS
  Filled 2023-10-22: qty 5

## 2023-10-22 MED ORDER — LIVING WELL WITH DIABETES BOOK
Freq: Once | Status: DC
  Filled 2023-10-22: qty 1

## 2023-10-22 MED ORDER — INSULIN GLARGINE-YFGN 100 UNIT/ML ~~LOC~~ SOLN
15.0000 [IU] | Freq: Once | SUBCUTANEOUS | Status: AC
  Administered 2023-10-22: 15 [IU] via SUBCUTANEOUS
  Filled 2023-10-22: qty 0.15

## 2023-10-22 MED ORDER — METFORMIN HCL ER 500 MG PO TB24
1000.0000 mg | ORAL_TABLET | Freq: Every day | ORAL | 0 refills | Status: DC
  Filled 2023-10-22 – 2023-11-27 (×2): qty 60, 30d supply, fill #0

## 2023-10-22 MED ORDER — INSULIN ASPART 100 UNIT/ML IJ SOLN
10.0000 [IU] | Freq: Once | INTRAMUSCULAR | Status: AC
  Administered 2023-10-22: 10 [IU] via SUBCUTANEOUS

## 2023-10-22 MED ORDER — OSELTAMIVIR PHOSPHATE 75 MG PO CAPS
75.0000 mg | ORAL_CAPSULE | Freq: Two times a day (BID) | ORAL | 0 refills | Status: AC
  Filled 2023-10-22: qty 7, 4d supply, fill #0

## 2023-10-22 MED ORDER — AZITHROMYCIN 250 MG PO TABS
500.0000 mg | ORAL_TABLET | Freq: Every day | ORAL | 0 refills | Status: AC
  Filled 2023-10-22: qty 2, 1d supply, fill #0

## 2023-10-22 MED ORDER — CEFTRIAXONE SODIUM 1 G IJ SOLR
1.0000 g | INTRAMUSCULAR | Status: DC
  Administered 2023-10-22: 1 g via INTRAVENOUS
  Filled 2023-10-22: qty 10

## 2023-10-22 MED ORDER — AMOXICILLIN-POT CLAVULANATE 875-125 MG PO TABS
1.0000 | ORAL_TABLET | Freq: Two times a day (BID) | ORAL | 0 refills | Status: AC
  Filled 2023-10-22: qty 12, 6d supply, fill #0

## 2023-10-22 MED ORDER — AMOXICILLIN-POT CLAVULANATE 875-125 MG PO TABS
1.0000 | ORAL_TABLET | Freq: Two times a day (BID) | ORAL | Status: DC
Start: 2023-10-23 — End: 2023-10-22

## 2023-10-22 MED ORDER — AZITHROMYCIN 250 MG PO TABS: 500.0000 mg | ORAL_TABLET | Freq: Every day | ORAL | Status: DC

## 2023-10-22 MED ORDER — ENOXAPARIN SODIUM 60 MG/0.6ML IJ SOSY: 60.0000 mg | PREFILLED_SYRINGE | INTRAMUSCULAR | Status: DC

## 2023-10-22 MED ORDER — METFORMIN HCL ER 500 MG PO TB24
500.0000 mg | ORAL_TABLET | Freq: Every day | ORAL | 0 refills | Status: DC
  Filled 2023-10-22: qty 30, 30d supply, fill #0

## 2023-10-22 NOTE — Inpatient Diabetes Management (Signed)
 Inpatient Diabetes Program Recommendations  AACE/ADA: New Consensus Statement on Inpatient Glycemic Control (2015)  Target Ranges:  Prepandial:   less than 140 mg/dL      Peak postprandial:   less than 180 mg/dL (1-2 hours)      Critically ill patients:  140 - 180 mg/dL   Lab Results  Component Value Date   GLUCAP 257 (H) 10/22/2023   HGBA1C 10.5 (H) 10/21/2023    Latest Reference Range & Units 10/21/23 16:36 10/21/23 18:56 10/21/23 20:37 10/21/23 22:39 10/22/23 06:32 10/22/23 08:29 10/22/23 11:16  Glucose-Capillary 70 - 99 mg/dL 535 (H) 550 (H) 585 (H) 401 (H) 174 (H) 160 (H) 257 (H)  (H): Data is abnormally high  Diabetes history: DM2 Outpatient Diabetes medications: Glucotrol 10 mg XL daily (just started 3 weeks ago) Current orders for Inpatient glycemic control: Novolog  0-20 units tid correction  Solumedrol 125 mg x 1 on 10/21/23 @ 10:28 am  Inpatient Diabetes Program Recommendations:   Spoke with pt about A1C 10.5 (Average blood glucose 255 over the past 2-3 months) and explained what an A1C is, basic pathophysiology of DM Type 2, basic home care, basic diabetes diet nutrition principles, importance of checking CBGs and maintaining good CBG control to prevent long-term and short-term complications. Reviewed signs and symptoms of hyperglycemia and hypoglycemia and how to treat hypoglycemia at home. Also reviewed blood sugar goals at home.  RNs to provide ongoing basic DM education at bedside with this patient. Have ordered educational booklet Living Well With Diabetes.  Patient states he just started on Glucotrol 10 mg 3 weeks ago and discontinued Metformin . Reviewed normal ranges of CBGs and discussed hypoglycemia and how oral Metformin  and Glucotrol work to control blood glucose.  Patient and wife acknowledge understanding. Reviewed plate method and good nutrition choices.Patient drinks mostly water and coffee. Expect good compliance.  Consider on discharge: -Add Metformin  500 mg  bid  Thank you, Shatisha Falter E. Hillis Mcphatter, RN, MSN, CDCES  Diabetes Coordinator Inpatient Glycemic Control Team Team Pager [phone] (8am-5pm) 10/22/2023 2:10 PM

## 2023-10-22 NOTE — Discharge Summary (Signed)
 Family Medicine Teaching [facility] Discharge Summary  Patient name: Keith Larsen Medical record number: [id number] Date of birth: [birth year] Age: 63 y.o. Gender: male Date of Admission: 10/21/2023  Date of Discharge: 10/22/2023 Admitting Physician: Kathrine Melena, DO  Primary Care Provider: Darra Hamilton, PA-C Consultants: None  Indication for Hospitalization: Hypoxia  Discharge Diagnoses/Problem List:  Principal Problem for Admission: PNA Other Problems addressed during stay:  Principal Problem:   Multifocal pneumonia Active Problems:   Type 2 diabetes mellitus (HCC)   Influenza A   Leukopenia   Hyperthyroidism   Pulmonary hypertension, primary (HCC)   Cirrhosis of liver [facility])   Brief Hospital Course:  Keith Larsen is a 63 y.o.male with a history of T2DM who was admitted to the [facility] Medicine Teaching Service at [facility] for multifocal PNA. His hospital course is detailed below:  Multifocal PNA  Influenza A  AHRF Positive for influenza A. CXR revealed concern for multifocal pneumonia which was confirmed with CTA chest. Started on CTX/Azithromycin . Increased oxygen requirement to 3L [place] with diffuse wheezing noted on exam and bibasilar crackles that improved with DuoNebs and Solu-Medrol . Oxygen was weaned to RA by time of discharge and he was transitioned to PO Augmentin  (end 02/10) and Azithromycin  (end 02/08).  Syncope (resolved) Multiple syncopal episodes in the days prior to his admission, likely due to underlying dehydration. Has had his similar presentation in the past (had taken unknown ED medication at that time). EKG in NSR with regular rate.  Echo demonstrated G2DD but no overt cause for his syncope. PT/OT evaluated and signed off.   Lymphopenia  WBC 2.8 with decreased lymphocytes on admission. Likely due to underlying Flu A; however, chart review revealed CT PE in 2015 which demonstrated mediastinal, bilateral hilar, bilateral axillary lymphadenopathy  concerning for lymphoma or metastatic disease. However, no follow-up was noted with heme hematology/oncology. CTA chest demonstrated similar findings that are concerning for possible sarcoidosis which could also represent cause of his lymphopenia. Plan for outpatient pulmonology referral.  T2DM A1c 10.5, on glipizide 10 mg daily. CBGs elevated likely secondary to receiving Solu-Medrol  upon admission. Started Semglee  15u inpatient and sugars improved. Eventually discharged on metformin  500mg  every day.   AKI (resolved)  Unknown baseline creatinine, previously 0.96.  Elevated to 1.3-1.4 upon admission, normalized with fluid resuscitation.  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 He has otherwise been asymptomatic from this standpoint. TSH low at 0.114 and T4 normal, outpatient follow up  Elevated LFTs  Cirrhosis  AST 114 and ALT 58 on admission. CTA chest captured marked capsular nodularity consistent with cirrhosis, likely in the setting of alcohol use disorder. Hepatitis panel negative and MELD score 11.   Oral Lesion Noted chronic white mucosal lesion on the floor of his mouth on the right side beneath tongue. Endorsed history of smoking cigars and that the lesion has been growing over time. Possible LAD throughout anterior neck vs prominent SCM muscle. Continue to monitor  Other chronic conditions were medically managed with home medications and formulary alternatives as necessary: HLD: Continue Crestor  10 mg daily.  Lipid panel normal with low HDL.   PCP Follow-up Recommendations: Consider ENT follow up outpatient for oral lesion.  Consider further workup for his possible hyperthyroidism (TSH low, T4 normal).  Titrate LAI and diabetes regimen as sugars indicate. Consider outpatient pulmonary consult to work up chronic lymphadenopathy for possible sarcoidosis Consider outpatient GI follow up for further cirrhosis workup  Consider repeat lab  work such as CBC, CMP  to monitor lymphopenia, liver function, renal function    Disposition: Home  Discharge Condition: Stable  Discharge Exam:  Vitals:   10/22/23 0800 10/22/23 1512  BP: 115/81 137/87  Pulse: 75 90  Resp: 16 15  Temp: 98.3 F (36.8 C) 98.1 F (36.7 C)  SpO2: 92% 93%   General: Resting comfortably in bed. NAD.  Cardiovascular: RRR. Normal S1/S2. No murmurs, rubs, or gallops. Respiratory: Bibasilar crackles present (L>R). Apical crackles present bilaterally. No increased WOB.  Abdomen: Soft, non-distended. No tenderness, rebound, guarding.  Extremities: Warm and well perfused. No edema.  Skin: No rashes  Significant Procedures: None  Significant Labs and Imaging:  Recent Labs  Lab 10/21/23 1008 10/21/23 1604 10/22/23 0013  WBC 2.7* 2.8* 2.4*  HGB 13.9 12.9* 12.8*  HCT 41.5 38.7* 37.4*  PLT 102* 117* 104*   Recent Labs  Lab 10/21/23 1008 10/21/23 1604 10/21/23 1721 10/22/23 0013  NA 132* 130*  --  134*  K 4.7 4.5  --  4.3  CL 96* 95*  --  101  CO2 25 21*  --  24  GLUCOSE 283* 513* 497* 367*  BUN 13 15  --  16  CREATININE 1.37* 1.41*  --  1.18  CALCIUM  8.5* 8.3*  --  8.8*  MG  --  1.8  --   --   ALKPHOS 94 88  --   --   AST 136* 114*  --   --   ALT 58* 58*  --   --   ALBUMIN 3.3* 3.0*  --   --     Pertinent Imaging: ECHOCARDIOGRAM COMPLETE Result Date: 10/22/2023 IMPRESSIONS  1. Left ventricular ejection fraction, by estimation, is 65 to 70%. The left ventricle has normal function. The left ventricle has no regional wall motion abnormalities. There is mild concentric left ventricular hypertrophy. Left ventricular diastolic parameters are consistent with Grade II diastolic dysfunction (pseudonormalization).  2. Right ventricular systolic function is normal. The right ventricular size is normal.  3. The mitral valve is normal in structure. Trivial mitral valve regurgitation. No evidence of mitral stenosis.  4. The aortic valve is normal in  structure. There is mild calcification of the aortic valve. Aortic valve regurgitation is not visualized. Aortic valve sclerosis/calcification is present, without any evidence of aortic stenosis.  5. The inferior vena cava is normal in size with greater than 50% respiratory variability, suggesting right atrial pressure of 3 mmHg.   CT Angio Chest Pulmonary Embolism (PE) W or WO Contrast Result Date: 10/21/2023 IMPRESSION: 1. Segmental and subsegmental arteries in both lower lung fields are obscured by respiratory motion. No evidence of arterial embolus elsewhere. 2. Prominent pulmonary trunk indicating arterial hypertension, 3.7 cm today, previously 3 cm. 3. Aortic and coronary artery atherosclerosis. 4. Decreased size of axillary lymph nodes since 2015. 5. Chronic bihilar and mediastinal adenopathy, with most of the mediastinal adenopathy now partially calcified suggesting either a sarcoid etiology, tubercular or fungal adenopathy, or treated lymphoma. Some of the mediastinal adenopathy is smaller than previously. None of it has increased in size. 6. Chronic ground-glass disease with bronchiolectasis in the posterior right lower lobe base. 7. Chronic subpleural reticulation in the posterolateral left base and lingular base. 8. Scattered areas of patchy ground-glass infiltrate in both upper lobes, most of which are peripheral. Additional ground-glass disease in the posterior basal left lower lobe. These opacities could be interval new chronic change or could indicate active pneumonia including viral etiologies. 9. Hepatic steatosis with capsular nodularity consistent  with cirrhosis, new from the prior exam. 10. Mild splenomegaly. Aortic Atherosclerosis (ICD10-I70.0).   DG Chest 2 View Result Date: 10/21/2023 IMPRESSION: 1. Bibasilar and right upper lobe airspace disease concerning for multifocal pneumonia. 2. Cardiomegaly without failure.     Results/Tests Pending at Time of Discharge: None  Discharge  Medications:  Allergies as of 10/22/2023   No Known Allergies      Medication List     STOP taking these medications    glipiZIDE 10 MG 24 hr tablet Commonly known as: GLUCOTROL XL   ibuprofen 200 MG tablet Commonly known as: ADVIL   Visine 0.025-0.3 % ophthalmic solution Generic drug: naphazoline-pheniramine       TAKE these medications    amoxicillin -clavulanate 875-125 MG tablet Commonly known as: AUGMENTIN  Take 1 tablet by mouth every 12 (twelve) hours for 6 days. Start taking on: October 23, 2023   azithromycin  250 MG tablet Commonly known as: ZITHROMAX  Take 2 tablets (500 mg total) by mouth daily for 1 day.   metFORMIN  500 MG 24 hr tablet Commonly known as: GLUCOPHAGE -XR Take 2 tablets (1,000 mg total) by mouth daily with breakfast.   multivitamin with minerals tablet Take 1 tablet by mouth daily. Prelox   oseltamivir  75 MG capsule Commonly known as: TAMIFLU  Take 1 capsule (75 mg total) by mouth 2 (two) times daily for 7 doses.   rosuvastatin  10 MG tablet Commonly known as: CRESTOR  Take 10 mg by mouth daily.        Discharge Instructions: Please refer to Patient Instructions section of EMR for full details.  Patient was counseled important signs and symptoms that should prompt return to medical care, changes in medications, dietary instructions, activity restrictions, and follow up appointments.   Follow-Up Appointments: [facility] on 2/10 @ 1:50PM   Theophilus Pagan, MD 10/22/2023, 7:26 PM PGY-2, [facility] Family Medicine

## 2023-10-22 NOTE — Assessment & Plan Note (Addendum)
 Likely prerenal iso dehydration of presenting symptoms. Cr normalized s/p fluid resuscitation.

## 2023-10-22 NOTE — Evaluation (Signed)
 Physical Therapy Evaluation Patient Details Name: Keith Larsen MRN: [id number] DOB: 11-29-1960 Today's Date: 10/22/2023  History of Present Illness  Pt is a 63yo male presenting to [facility] on 10/21/23 with cough, dyspnea, fever, and syncopal episodes. Pt being worked up for Multifocal Pneumonia, recent dx of the flu on 10/18/23. PMH of DM II, knee sx.   Clinical Impression  Pt in bed upon arrival and agreeable to PT eval. Prior to admit, pt was independent with no AD for mobility. Pt presents slightly below previous level of mobility with to fatigue 2/2 influenza. Pt was independent with no AD for gait and stair negotiation. Pt scored a 23/24 on the DGI demonstrating no increased risk of falling or balance impairments. Educated pt on energy conservation techniques and safe mobility upon d/c home. Pt has 24/7 physical assist available at home if needed. Pt has no acute PT needs with acute PT signing off. Recommend services from mobility specialists to help pt continue mobilizing while in the hospital.     If plan is discharge home, recommend the following: Assist for transportation;Help with stairs or ramp for entrance   Can travel by private vehicle    Yes    Equipment Recommendations None recommended by PT     Functional Status Assessment Patient has had a recent decline in their functional status and demonstrates the ability to make significant improvements in function in a reasonable and predictable amount of time.     Precautions / Restrictions Precautions Precautions: None Restrictions Weight Bearing Restrictions Per Provider Order: No      Mobility  Bed Mobility Overal bed mobility: Independent   Transfers Overall transfer level: Independent Equipment used: None   Ambulation/Gait Ambulation/Gait assistance: Independent Gait Distance (Feet): 160 Feet Assistive device: None Gait Pattern/deviations: WFL(Within Functional Limits)   Stairs Stairs: Yes Stairs assistance:  Independent Stair Management: One rail Left, Alternating pattern, Forwards Number of Stairs: 12 General stair comments: steady w/ no LOB    Balance Overall balance assessment: Independent    Standardized Balance Assessment Standardized Balance Assessment : Dynamic Gait Index   Dynamic Gait Index Level Surface: Normal Change in Gait Speed: Normal Gait with Horizontal Head Turns: Normal Gait with Vertical Head Turns: Normal Gait and Pivot Turn: Normal Step Over Obstacle: Normal Step Around Obstacles: Normal Steps: Mild Impairment Total Score: 23       Pertinent Vitals/Pain Pain Assessment Pain Assessment: No/denies pain    Home Living Family/patient expects to be discharged to:: Private residence Living Arrangements: Spouse/significant other Available Help at Discharge: Family;Available 24 hours/day Type of Home: House Home Access: Level entry     Alternate Level Stairs-Number of Steps: 12 Home Layout: Two level;Bed/bath upstairs Home Equipment: None      Prior Function Prior Level of Function : Independent/Modified Independent;Driving;Working/employed      Mobility Comments: independent, syncopal episodes happen with no precipitating events when sitting, hot flashes       Extremity/Trunk Assessment   Upper Extremity Assessment Upper Extremity Assessment: Defer to OT evaluation    Lower Extremity Assessment Lower Extremity Assessment: Overall WFL for tasks assessed    Cervical / Trunk Assessment Cervical / Trunk Assessment: Normal  Communication   Communication Communication: No apparent difficulties  Cognition Arousal: Alert Behavior During Therapy: WFL for tasks assessed/performed Overall Cognitive Status: Within Functional Limits for tasks assessed     General Comments General comments (skin integrity, edema, etc.): VSS on RA, reported being fatigued after ambulating     PT Assessment Patient  does not need any further PT services   AM-PAC PT  6 Clicks Mobility  Outcome Measure Help needed turning from your back to your side while in a flat bed without using bedrails?: None Help needed moving from lying on your back to sitting on the side of a flat bed without using bedrails?: None Help needed moving to and from a bed to a chair (including a wheelchair)?: None Help needed standing up from a chair using your arms (e.g., wheelchair or bedside chair)?: None Help needed to walk in hospital room?: None Help needed climbing 3-5 steps with a railing? : None 6 Click Score: 24    End of Session   Activity Tolerance: Patient tolerated treatment well Patient left: in bed;with call bell/phone within reach Nurse Communication: Mobility status PT Visit Diagnosis: Other abnormalities of gait and mobility (R26.89)    Time: 9148-9094 PT Time Calculation (min) (ACUTE ONLY): 14 min   Charges:   PT Evaluation $PT Eval Low Complexity: 1 Low   PT General Charges $$ ACUTE PT VISIT: 1 Visit         Kate ORN, PT, DPT Secure Chat Preferred  Rehab Office [phone]   Kate BRAVO Wendolyn 10/22/2023, 9:09 AM

## 2023-10-22 NOTE — Assessment & Plan Note (Addendum)
 A1c 10.5, not controlled on Glipizide 10 mg daily at home. CBGs likely elevated at this time 2/2 receiving Solumedrol but has come down on SSI/LAI and discontinuing steroids.  - Continue SSI, glargine 10u - Diabetes education  - Discharge with Metformin  500mg  daily, may be good candidate for Liberate trial prior to insulin  initiation - Monitor CBGs

## 2023-10-22 NOTE — Telephone Encounter (Signed)
 Pharmacy Patient Advocate Encounter  Insurance verification completed.    The patient is insured through [employer]. Patient has [employer], may use a copay card, and/or apply for patient assistance if available.    Ran test claim for OZEMPIC  0.25MG  OR 0.5MG  DOSE.  UNABLE TO OBTAIN PRICE DUE TO PATIENT NOT ELIGIBLE DUE TO NON PAYMENT OF PREMIUMS.   This test claim was processed through [facility]- copay amounts may vary at other pharmacies due to pharmacy/plan contracts, or as the patient moves through the different stages of their insurance plan.

## 2023-10-22 NOTE — Discharge Instructions (Signed)
 Dear Keith Larsen,   Thank you for letting us  participate in your care! In this section, you will find a brief hospital admission summary of why you were admitted to the hospital, what happened during your admission, your diagnosis/diagnoses, and recommended follow up.   You were diagnosed with pneumonia and treated with antibiotics.  Please continue pleat the course of oral antibiotics and Tamiflu  for your symptoms.  Please follow-up in the [facility] for continued care as you will likely need referral to pulmonology for evaluation of the enlarged lymph nodes you have.   POST-HOSPITAL & CARE INSTRUCTIONS We recommend following up with your PCP within 1 week from being discharged from the hospital. Please let PCP/Specialists know of any changes in medications that were made which you will be able to see in the medications section of this packet.  DOCTOR'S APPOINTMENTS & FOLLOW UP Future Appointments  Date Time Provider Department Center  10/25/2023  1:50 PM ACCESS TO CARE POOL [id number]     Thank you for choosing [facility]! Take care and be well!  Family Medicine Teaching Service Inpatient Team [place]  [facility]  [address] [place], [place] [address] [phone]

## 2023-10-22 NOTE — Assessment & Plan Note (Addendum)
 Incidental finding noted on CTA chest, pulmonary trunk measured at 3.7cm. No evidence of significant right heart strain on echo this admission.  - Outpt follow up for further management

## 2023-10-22 NOTE — Progress Notes (Signed)
 SATURATION QUALIFICATIONS: (This note is used to comply with regulatory documentation for home oxygen)  Patient Saturations on Room Air at Rest = 95%  Patient Saturations on Room Air while Ambulating = 93%  Patient Saturations on 0 Liters of oxygen while Ambulating = 93%  Please briefly explain why patient needs home oxygen: No Home 02 needs

## 2023-10-22 NOTE — Assessment & Plan Note (Addendum)
 Likely superimposed 2/2 underlying Flu A. Respiratory function has now improved s/p breathing treatments and antibiotics; now satting well and breathing comfortably on RA. - Transition to PO Augmentin  (continue to 02/10), and Azithromycin  (2/6-2/8) - Duonebs q4h prn  - Continue lidocaine  patch for chest wall pain secondary to with coughing

## 2023-10-22 NOTE — Progress Notes (Signed)
 OT Screen Note  Patient Details Name: Keith Larsen MRN: [id number] DOB: 1961/02/03   Cancelled Treatment:    Reason Eval/Treat Not Completed: OT screened, no needs identified, will sign off (Discussed with PT, pt ind with ADLs and has no acute skilled OT needs. Mobilizing OOB well, OT signing off.)  10/22/2023  AB, OTR/L  Acute Rehabilitation Services  Office: [phone]   Keith Larsen 10/22/2023, 10:28 AM

## 2023-10-22 NOTE — Assessment & Plan Note (Deleted)
 AST 114 and ALT 58.  Has some hepatomegaly to ***. CT with ***. No history of alcohol abuse. 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  Possibly due to influenza w/ superimposed PNA d/t hypoxia and immune response related to hepatocellular injury. - hepatitis panel

## 2023-10-22 NOTE — Assessment & Plan Note (Addendum)
-   Continue course of Tamiflu  (end 02/10) - Supportive care - Tylenol  PRN

## 2023-10-22 NOTE — Assessment & Plan Note (Addendum)
?  Exophthalmos noted on admission exam but otherwise no overt symptoms of hyperthyroidism. TSH low but with normal T4. - Monitor closely; will need close outpatient follow up for further workup and management

## 2023-10-22 NOTE — Assessment & Plan Note (Addendum)
 WBC 2.8 with decreased lymphocytes, likely secondary to viral etiology. Upon chart review, CTPE from 2015 demonstrated mediastinal, bilateral hilar, and bilateral axillary lymphadenopathy, concerning for underlying malignancy or sarcoidosis. Doesn't seems like he followed up with heme/onc as recommended at that time. Repeat CTA chest this admission demonstrated sustained, but regressed and calcified hilar LAD consistent with possible sarcoidosis.  - Will need monitoring outpatient and pulmonology follow up for further workup

## 2023-10-22 NOTE — Assessment & Plan Note (Addendum)
 AST 114 and ALT 58, thrombocytopenia on admission labs. Quit drinking etoh in January but had previously been binge drinking for years on the weekends. Incidental finding noted on CTA chest of hepatic steatosis with capsular nodularity consistent with new cirrhosis. - Obtain hepatitis panel, INR to determine MELD score - Will need outpatient follow up with GI for further workup

## 2023-10-22 NOTE — Assessment & Plan Note (Addendum)
 Likely secondary to dehydration iso his presentation. Echo in 2015 with EF of 60 to 65% with G1DD. EKG w/ NSR on admission.Given fluid resuscitation and has been without recurrence of syncope while admitted. PT/OT evaluated and signed off. Repeat echo with G2DD but no overt cause of his syncope otherwise.

## 2023-10-22 NOTE — Progress Notes (Addendum)
 Daily Progress Note Intern Pager: [phone]  Patient name: Keith Larsen Medical record number: [id number] Date of birth: [birth year] Age: 63 y.o. Gender: male  Primary Care Provider: Darra Hamilton, PA-C Consultants: None Code Status: Full  Pt Overview and Major Events to Date:  02/06: Admitted, CTA chest performed 02/07: Transitioned to PO abx, echo performed   Assessment and Plan:  Keith Larsen is a 63 y.o. male with a pertinent PMHx of T2DM, who was admitted for multifocal pneumonia on imaging in the setting of confirmed influenza A infection, AKI, and syncopal episodes.   Assessment & Plan Multifocal pneumonia Likely superimposed 2/2 underlying Flu A. Respiratory function has now improved s/p breathing treatments and antibiotics; now satting well and breathing comfortably on RA. - Transition to PO Augmentin  (continue to 02/10), and Azithromycin  (2/6-2/8) - Duonebs q4h prn  - Continue lidocaine  patch for chest wall pain secondary to with coughing  Influenza A - Continue course of Tamiflu  (end 02/10) - Supportive care - Tylenol  PRN Leukopenia WBC 2.8 with decreased lymphocytes, likely secondary to viral etiology. Upon chart review, CTPE from 2015 demonstrated mediastinal, bilateral hilar, and bilateral axillary lymphadenopathy, concerning for underlying malignancy or sarcoidosis. Doesn't seems like he followed up with heme/onc as recommended at that time. Repeat CTA chest this admission demonstrated sustained, but regressed and calcified hilar LAD consistent with possible sarcoidosis.  - Will need monitoring outpatient and pulmonology follow up for further workup  Type 2 diabetes mellitus (HCC) A1c 10.5, not controlled on Glipizide 10 mg daily at home. CBGs likely elevated at this time 2/2 receiving Solumedrol but has come down on SSI/LAI and discontinuing steroids.  - Continue SSI, glargine 10u - Diabetes education  - Discharge with Metformin  500mg  daily, may be good  candidate for Liberate trial prior to insulin  initiation - Monitor CBGs Pulmonary hypertension, primary (HCC) Incidental finding noted on CTA chest, pulmonary trunk measured at 3.7cm. No evidence of significant right heart strain on echo this admission.  - Outpt follow up for further management  Cirrhosis of liver (HCC) AST 114 and ALT 58, thrombocytopenia on admission labs. Quit drinking etoh in January but had previously been binge drinking for years on the weekends. Incidental finding noted on CTA chest of hepatic steatosis with capsular nodularity consistent with new cirrhosis. - Obtain hepatitis panel, INR to determine MELD score - Will need outpatient follow up with GI for further workup  Hyperthyroidism ?Exophthalmos noted on admission exam but otherwise no overt symptoms of hyperthyroidism. TSH low but with normal T4. - Monitor closely; will need close outpatient follow up for further workup and management  Syncope (Resolved: 10/22/2023) Likely secondary to dehydration iso his presentation. Echo in 2015 with EF of 60 to 65% with G1DD. EKG w/ NSR on admission.Given fluid resuscitation and has been without recurrence of syncope while admitted. PT/OT evaluated and signed off. Repeat echo with G2DD but no overt cause of his syncope otherwise.  AKI (acute kidney injury) (HCC) (Resolved: 10/22/2023) Likely prerenal iso dehydration of presenting symptoms. Cr normalized s/p fluid resuscitation.  FEN/GI: Normal diet  PPx: Lovenox  60mg   Dispo:Home  today or tomorrow . Barriers include pending labs, diabetes education.   Subjective:  NAEON. Pt feels overall improved and is now off of oxygen completely. Has continued to experience right chest wall pain secondary to only coughing which has improved with a lidocaine  patch.   Objective: Temp:  [97.7 F (36.5 C)-99.5 F (37.5 C)] 98.3 F (36.8 C) (02/07 0800) Pulse  Rate:  [75-102] 75 (02/07 0800) Resp:  [16-31] 16 (02/07 0800) BP:  (103-146)/(67-90) 115/81 (02/07 0800) SpO2:  [91 %-99 %] 92 % (02/07 0800) Weight:  [123.3 kg] 123.3 kg (02/06 1707) Physical Exam: General: Resting comfortably in bed. NAD.  Cardiovascular: RRR. Normal S1/S2. No murmurs, rubs, or gallops. Respiratory: Bibasilar crackles present (L>R). Apical crackles present bilaterally. No increased WOB.  Abdomen: Soft, non-distended. No tenderness, rebound, guarding.  Extremities: Warm and well perfused. No edema.  Skin: No rashes.   Laboratory: Most recent CBC Lab Results  Component Value Date   WBC 2.4 (L) 10/22/2023   HGB 12.8 (L) 10/22/2023   HCT 37.4 (L) 10/22/2023   MCV 89.7 10/22/2023   PLT 104 (L) 10/22/2023   Most recent BMP    Latest Ref Rng & Units 10/22/2023   12:13 AM  BMP  Glucose 70 - 99 mg/dL 632   BUN 6 - 20 mg/dL 16   Creatinine 9.38 - 1.24 mg/dL 8.81   Sodium 864 - 854 mmol/L 134   Potassium 3.5 - 5.1 mmol/L 4.3   Chloride 98 - 111 mmol/L 101   CO2 22 - 32 mmol/L 24   Calcium  8.9 - 10.3 mg/dL 8.8    Other pertinent labs: BHA and bicarb wnl, BNP wnl, Mg wnl, HIV negative  Imaging/Diagnostic Tests: Radiologist Impression: CTA chest from 02/06 1. Segmental and subsegmental arteries in both lower lung fields are obscured by respiratory motion. No evidence of arterial embolus elsewhere. 2. Prominent pulmonary trunk indicating arterial hypertension, 3.7 cm today, previously 3 cm. 3. Aortic and coronary artery atherosclerosis. 4. Decreased size of axillary lymph nodes since 2015. 5. Chronic bihilar and mediastinal adenopathy, with most of the mediastinal adenopathy now partially calcified suggesting either a sarcoid etiology, tubercular or fungal adenopathy, or treated lymphoma. Some of the mediastinal adenopathy is smaller than previously. None of it has increased in size. 6. Chronic ground-glass disease with bronchiolectasis in the posterior right lower lobe base. 7. Chronic subpleural reticulation in the  posterolateral left base and lingular base. 8. Scattered areas of patchy ground-glass infiltrate in both upper lobes, most of which are peripheral. Additional ground-glass disease in the posterior basal left lower lobe. These opacities could be interval new chronic change or could indicate active pneumonia including viral etiologies. 9. Hepatic steatosis with capsular nodularity consistent with cirrhosis, new from the prior exam. 10. Mild splenomegaly. My interpretation: Same  Drena Fallow, Medical Student 10/22/2023, 9:02 AM [facility] Family Medicine FPTS Intern pager: [phone], text pages welcome Secure chat group [facility] Teaching Service   Upper Level Addendum:   I have seen and evaluated this patient along with Student Doctor Drena and reviewed the above note, making necessary revisions as appropriate.  I agree with the medical decision making and physical exam as noted above.   Izetta Nap, DO PGY-2, [facility] Family [facility] Residency

## 2023-10-23 ENCOUNTER — Other Ambulatory Visit (HOSPITAL_COMMUNITY): Payer: Self-pay

## 2023-10-25 ENCOUNTER — Ambulatory Visit (INDEPENDENT_AMBULATORY_CARE_PROVIDER_SITE_OTHER): Payer: Commercial Managed Care - HMO | Admitting: Student

## 2023-10-25 VITALS — BP 127/87 | HR 91 | Temp 97.9°F | Ht 72.0 in | Wt 258.1 lb

## 2023-10-25 DIAGNOSIS — K7030 Alcoholic cirrhosis of liver without ascites: Secondary | ICD-10-CM | POA: Diagnosis not present

## 2023-10-25 DIAGNOSIS — R590 Localized enlarged lymph nodes: Secondary | ICD-10-CM | POA: Diagnosis not present

## 2023-10-25 DIAGNOSIS — K1370 Unspecified lesions of oral mucosa: Secondary | ICD-10-CM

## 2023-10-25 DIAGNOSIS — J101 Influenza due to other identified influenza virus with other respiratory manifestations: Secondary | ICD-10-CM | POA: Diagnosis not present

## 2023-10-25 DIAGNOSIS — E119 Type 2 diabetes mellitus without complications: Secondary | ICD-10-CM

## 2023-10-25 DIAGNOSIS — D72819 Decreased white blood cell count, unspecified: Secondary | ICD-10-CM

## 2023-10-25 NOTE — Progress Notes (Signed)
    SUBJECTIVE:   CHIEF COMPLAINT / HPI: Hospital fu  Admitted 2/6-2/7 Was positive for influenza A + superimposed infection and chest imaging confirmed multifocal pneumonia as well   They report feeling better but are still regaining their energy.  They are currently on a course of antibiotics Augmentin  with three days remaining. Their blood glucose levels have been fluctuating between 155 and 300, with the lowest being around 100.  They are currently managing their diabetes with metformin , taking one 1000mg  pill daily. They deny ever being on insulin  therapy. They also have a tongue lesion that has been present for about a year, which they initially thought would resolve on its own.   PCP Follow-up Recommendations: Consider ENT follow up outpatient for oral lesion. -Referred Consider further workup for his possible hyperthyroidism (TSH low, T4 normal). -1 month follow-up Titrate LAI and diabetes regimen as sugars indicate. -- highest was 300, sugars 160s today -- metformin  only taking 1 pill a day Consider outpatient pulmonary consult to work up chronic lymphadenopathy for possible sarcoidosis-referred Consider outpatient GI follow up for further cirrhosis workup -referred Consider repeat lab work such as CBC, CMP to monitor lymphopenia, liver function, renal function -obtained   PERTINENT  PMH / PSH: Pulmonary hypertension, liver cirrhosis, T2DM  OBJECTIVE:   BP 127/87   Pulse 91   Temp 97.9 F (36.6 C) (Oral)   Ht 6' (1.829 m)   Wt 258 lb 2 oz (117.1 kg)   SpO2 94%   BMI 35.01 kg/m   General: Well appearing, NAD, awake, alert, responsive to questions Head: Normocephalic atraumatic, exophthalmos, tongue white lesion under CV: Regular rate and rhythm no murmurs rubs or gallops Respiratory: Clear to ausculation bilaterally, no wheezes rales or crackles, chest rises symmetrically,  no increased work of breathing Extremities: Moves upper and lower extremities freely, no edema  in LE  ASSESSMENT/PLAN:   Assessment & Plan Alcoholic cirrhosis, unspecified whether ascites present (HCC) Seen on imaging in hospital -CMP -GI referral Mediastinal adenopathy Concern for sarcoidosis on CT scan -Pulmonology referral Oral lesion White tongue lesion that has been there for a year and growing.  Cigar smoker -ENT referral Influenza A With superimposed pneumonia.  No further fevers and doing well on room air. -Patient to finish Augmentin  course Leukopenia, unspecified type WBC in the 2's during hospitalization. -CBC Type 2 diabetes mellitus without complication, without long-term current use of insulin  (HCC) Sugars in the 160s today.  Patient only taking 1 metformin  a day. -Increase metformin  to 1000 daily -If staying in the 200s to 300s discussed with patient to call the clinic back we may need to consider insulin  -1 month f/u   Recommend repeat TSH at follow-up in 1 month  Genora Kidd, MD [facility]

## 2023-10-25 NOTE — Assessment & Plan Note (Addendum)
 Sugars in the 160s today.  Patient only taking 1 metformin  a day. -Increase metformin  to 1000 daily -If staying in the 200s to 300s discussed with patient to call the clinic back we may need to consider insulin  -1 month f/u

## 2023-10-25 NOTE — Patient Instructions (Addendum)
 It was great to see you! Thank you for allowing me to participate in your care!   Our plans for today:  -We will get some labs for repeat today -I am referring you to the lung doctors for possible sarcoidosis, the stomach doctors for liver cirrhosis and ear nose and throat doctor for your tongue lesion -Increase metformin  to 2 pills in the morning--if sugars in the 200s-300s we need to add different medicines on for you -1 month follow up with Dr. Ival Marines  Take care and seek immediate care sooner if you develop any concerns.  Genora Kidd, MD

## 2023-10-25 NOTE — Assessment & Plan Note (Signed)
 WBC in the 2's during hospitalization. -CBC

## 2023-10-25 NOTE — Assessment & Plan Note (Signed)
 With superimposed pneumonia.  No further fevers and doing well on room air. -Patient to finish Augmentin  course

## 2023-10-25 NOTE — Assessment & Plan Note (Signed)
 Seen on imaging in hospital -CMP -GI referral

## 2023-10-26 ENCOUNTER — Encounter (HOSPITAL_BASED_OUTPATIENT_CLINIC_OR_DEPARTMENT_OTHER): Payer: Self-pay | Admitting: Student

## 2023-10-26 ENCOUNTER — Encounter: Payer: Self-pay | Admitting: Student

## 2023-10-26 LAB — COMPREHENSIVE METABOLIC PANEL
ALT: 55 [IU]/L — ABNORMAL HIGH (ref 0–44)
AST: 35 [IU]/L (ref 0–40)
Albumin: 3.6 g/dL — ABNORMAL LOW (ref 3.8–4.9)
Alkaline Phosphatase: 88 [IU]/L (ref 44–121)
BUN/Creatinine Ratio: 10 (ref 10–24)
BUN: 11 mg/dL (ref 8–27)
Bilirubin Total: 0.8 mg/dL (ref 0.0–1.2)
CO2: 27 mmol/L (ref 20–29)
Calcium: 8.9 mg/dL (ref 8.6–10.2)
Chloride: 99 mmol/L (ref 96–106)
Creatinine, Ser: 1.05 mg/dL (ref 0.76–1.27)
Globulin, Total: 3 g/dL (ref 1.5–4.5)
Glucose: 221 mg/dL — ABNORMAL HIGH (ref 70–99)
Potassium: 4 mmol/L (ref 3.5–5.2)
Sodium: 139 mmol/L (ref 134–144)
Total Protein: 6.6 g/dL (ref 6.0–8.5)
eGFR: 81 mL/min/{1.73_m2} (ref >59)

## 2023-10-26 LAB — CBC
Hematocrit: 41.4 % (ref 37.5–51.0)
Hemoglobin: 13.6 g/dL (ref 13.0–17.7)
MCH: 30 pg (ref 26.6–33.0)
MCHC: 32.9 g/dL (ref 31.5–35.7)
MCV: 91 fL (ref 79–97)
Platelets: 210 10*3/uL (ref 150–450)
RBC: 4.54 x10E6/uL (ref 4.14–5.80)
RDW: 11.6 % (ref 11.6–15.4)
WBC: 4.1 10*3/uL (ref 3.4–10.8)

## 2023-10-26 LAB — CULTURE, BLOOD (ROUTINE X 2)
Culture: NO GROWTH
Culture: NO GROWTH
Special Requests: ADEQUATE

## 2023-11-09 ENCOUNTER — Encounter (INDEPENDENT_AMBULATORY_CARE_PROVIDER_SITE_OTHER): Payer: Self-pay | Admitting: Otolaryngology

## 2023-11-24 NOTE — Progress Notes (Signed)
    SUBJECTIVE:   CHIEF COMPLAINT / HPI:   Hospital follow-up Seen 2/10 at [facility] for hospital follow-up due to admission for influenza A and multifocal pneumonia. Continues to recover, still having cough but thinks it's improving. Occasional blood-tinged spit up.  Appt with Pulmonology next week to f/u on possible sarcoidosis.  Appt with ENT in 01/16/2024 to f/u on white lesions on tongue  Diabetes Current Regimen: Metformin 1000 mg daily CBGs: Has not been checking for at least 2+ weeks. Reports BGL 197 then 240 but unsure time of day Last A1c:  Lab Results  Component Value Date   HGBA1C 10.5 (H) 10/21/2023    Denies polyuria, polydipsia, hypoglycemia. Last Eye Exam: [facility] - normal exam in 07/2023 per patient Statin: Rosuvastatin 10 mg daily ACE/ARB: None  PERTINENT  PMH / PSH: T2DM  OBJECTIVE:   BP 122/80   Pulse 87   Ht 6' (1.829 m)   Wt 254 lb 2 oz (115.3 kg)   SpO2 93%   BMI 34.47 kg/m    General: NAD, pleasant, able to participate in exam Cardiac: RRR, no murmurs. Respiratory: Normal effort on RA. Some mild diminished breath sounds at lung bases. Extremities: no edema or cyanosis. Skin: warm and dry. Area of hyperpigmented and hypopigmented areas on arms, back and legs. Rough with some areas of excoriation. Neuro: alert, no obvious focal deficits Psych: Normal affect and mood    ASSESSMENT/PLAN:   Assessment & Plan Type 2 diabetes mellitus without complication, without long-term current use of insulin (HCC) A1c 10.5 on 10/21/2023, patient reports feeling like BGL have been good but has not been checking for 2+ weeks. Certainly needs additional medication management but patient prefers getting in with Pharmacy team for CGM trial to better guide management. -Schedule with Dr. Raymondo Band for possibly enrollment in CGM trial -ACR Low TSH level Low while hospitalized, repeat TSH today. Alcoholic cirrhosis, unspecified whether ascites present [facility]) Provided  referral information to schedule with GI. Tinea versicolor Present on arms, back and legs. -Ketoconazole 2% shampoo on affected areas   Dr. Elberta Fortis, DO [facility]

## 2023-11-26 ENCOUNTER — Ambulatory Visit (INDEPENDENT_AMBULATORY_CARE_PROVIDER_SITE_OTHER): Payer: PRIVATE HEALTH INSURANCE | Admitting: Family Medicine

## 2023-11-26 ENCOUNTER — Encounter: Payer: Self-pay | Admitting: Family Medicine

## 2023-11-26 VITALS — BP 122/80 | HR 87 | Ht 72.0 in | Wt 254.1 lb

## 2023-11-26 DIAGNOSIS — K7030 Alcoholic cirrhosis of liver without ascites: Secondary | ICD-10-CM

## 2023-11-26 DIAGNOSIS — Z7984 Long term (current) use of oral hypoglycemic drugs: Secondary | ICD-10-CM

## 2023-11-26 DIAGNOSIS — R7989 Other specified abnormal findings of blood chemistry: Secondary | ICD-10-CM

## 2023-11-26 DIAGNOSIS — B360 Pityriasis versicolor: Secondary | ICD-10-CM

## 2023-11-26 DIAGNOSIS — E119 Type 2 diabetes mellitus without complications: Secondary | ICD-10-CM

## 2023-11-26 NOTE — Patient Instructions (Signed)
 It was wonderful to see you today! Thank you for choosing [facility].   Please bring ALL of your medications with you to every visit.   Today we talked about:   Please schedule with Dr. Raymondo Band to discuss the continues glucose monitor. I think you likely need more than the metformin to manage her blood sugar but without any blood sugar readings it is hard for me to know how to best manage you.  Please stop at the front to schedule with him as he can discuss setting up the monitor and having it up to our clinic access. Please schedule with the GI doctor using the information below.  They will need to discuss cirrhosis with a further and may even need to do an endoscopy given that feeling that food is getting stuck in her throat. We are rechecking her thyroid function today, I will let you know if it is abnormal and further workup is needed.  [facility] [address] [place],  [place]  [address] Main: [phone]  Please follow up with Dr. Raymondo Band to discuss diabetes and 3 months with me  If you haven't already, sign up for My Chart to have easy access to your labs results, and communication with your primary care physician.   We are checking some labs today. If they are abnormal, I will call you. If they are normal, I will send you a MyChart message (if it is active) or a letter in the mail. If you do not hear about your labs in the next 2 weeks, please call the office.  Call the clinic at [phone] if your symptoms worsen or you have any concerns.  Please be sure to schedule follow up at the front desk before you leave today.   Elberta Fortis, DO Family Medicine

## 2023-11-27 ENCOUNTER — Other Ambulatory Visit (HOSPITAL_COMMUNITY): Payer: Self-pay

## 2023-11-27 LAB — TSH RFX ON ABNORMAL TO FREE T4: TSH: 0.809 u[IU]/mL (ref 0.450–4.500)

## 2023-11-29 ENCOUNTER — Encounter: Payer: Self-pay | Admitting: Family Medicine

## 2023-11-29 DIAGNOSIS — B360 Pityriasis versicolor: Secondary | ICD-10-CM | POA: Insufficient documentation

## 2023-11-29 MED ORDER — METFORMIN HCL ER 500 MG PO TB24: 1000.0000 mg | ORAL_TABLET | Freq: Every day | ORAL | 0 refills | Status: DC

## 2023-11-29 MED ORDER — KETOCONAZOLE 2 % EX SHAM: 1.0000 | MEDICATED_SHAMPOO | Freq: Every day | CUTANEOUS | 0 refills | Status: AC

## 2023-11-29 NOTE — Assessment & Plan Note (Signed)
 Present on arms, back and legs. -Ketoconazole 2% shampoo on affected areas

## 2023-11-29 NOTE — Assessment & Plan Note (Signed)
 Provided referral information to schedule with GI.

## 2023-11-29 NOTE — Assessment & Plan Note (Signed)
 A1c 10.5 on 10/21/2023, patient reports feeling like BGL have been good but has not been checking for 2+ weeks. Certainly needs additional medication management but patient prefers getting in with Pharmacy team for CGM trial to better guide management. -Schedule with Dr. Raymondo Band for possibly enrollment in CGM trial -ACR

## 2023-12-03 ENCOUNTER — Ambulatory Visit: Payer: Commercial Managed Care - HMO | Admitting: Pulmonary Disease

## 2023-12-03 ENCOUNTER — Encounter: Payer: Self-pay | Admitting: Pulmonary Disease

## 2023-12-03 VITALS — BP 132/82 | HR 93 | Temp 97.1°F | Ht 72.0 in | Wt 256.6 lb

## 2023-12-03 DIAGNOSIS — R040 Epistaxis: Secondary | ICD-10-CM

## 2023-12-03 DIAGNOSIS — R590 Localized enlarged lymph nodes: Secondary | ICD-10-CM | POA: Diagnosis not present

## 2023-12-03 DIAGNOSIS — R042 Hemoptysis: Secondary | ICD-10-CM

## 2023-12-03 MED ORDER — AZITHROMYCIN 500 MG PO TABS
500.0000 mg | ORAL_TABLET | Freq: Every day | ORAL | 0 refills | Status: AC
Start: 2023-12-03 — End: 2023-12-06

## 2023-12-03 NOTE — Patient Instructions (Signed)
 VISIT SUMMARY:  Keith Larsen, during your visit today, we discussed your recent symptoms of coughing up blood and nasal congestion. We reviewed your history of chronic mediastinal adenopathy and recent pneumonia. We have outlined a plan to address your current symptoms and ensure your overall health.  YOUR PLAN:  -HEMOPTYSIS: Hemoptysis means coughing up blood. This can be due to various reasons, including infections or irritation in the lungs. We will perform a chest CT scan to check for any infections or other lung issues. Additionally, we have prescribed antibiotics to treat a possible sinus infection. If the coughing up blood continues, we may need to do a bronchoscopy, which is a procedure to look inside your airways.  -SINUS CONGESTION: Sinus congestion can cause nasal stuffiness and nosebleeds. It might be due to a sinus infection. We have prescribed antibiotics to help clear up any possible infection.  -MEDIASTINAL ADENOPATHY: Mediastinal adenopathy refers to enlarged lymph nodes in the chest, often related to conditions like sarcoidosis. Your condition has been stable and is not causing significant symptoms. This is a common and usually benign condition, and we will continue to monitor it.  INSTRUCTIONS:  Please schedule a follow-up appointment in 1-2 weeks to review the results of your chest CT scan and to assess your symptoms. If you experience any worsening of symptoms or new issues, contact our office immediately.

## 2023-12-03 NOTE — Progress Notes (Signed)
 Subjective:    Patient ID: Keith Larsen, male    DOB: [birth year], 63 y.o.   MRN: [id number]  Patient Care Team: Elberta Fortis, MD as PCP - General (Family Medicine) Salena Saner, MD as Consulting Physician (Pulmonary Disease)  Chief Complaint  Patient presents with   Consult    No SOB or wheezing. Cough with bloody sputum.     BACKGROUND: Patient is a 63 year old lifelong never smoker who presents for evaluation of chronic mediastinal adenopathy.  HPI Discussed the use of AI scribe software for clinical note transcription with the patient, who gave verbal consent to proceed.  History of Present Illness   Keith Larsen is a 63 year old male with chronic mediastinal adenopathy who presents with minor hemoptysis.  He has experienced minor hemoptysis over the past two weeks, with blood expectoration occurring primarily during coughing episodes.  He is not clear as to the amount of blood however it appears that is mostly streaky hemoptysis but that the frequency has increased some. Coughing episodes are more frequent at night, waking him up two to three times, though he feels fine during the day.  He also experiences epistaxis, especially when blowing his nose, which has become more pronounced in the last two days. No history of sinus problems but notes nasal congestion and bleeding when working in the yard.  In February, he had the flu, which progressed to pneumonia, requiring overnight hospitalization and antibiotic treatment. He completed the antibiotics six weeks ago and experienced a reaction resulting in dry skin. Since the pneumonia, he has had a persistent cough, now associated with hemoptysis.  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  He denies smoking cigarettes but mentions occasional cigar use. He has experienced some weight  loss, attributed to decreased appetite, but does not consider it significant.     Review of Systems A 10 point review of systems was performed and it is as noted above otherwise negative.   Past Medical History:  Diagnosis Date   Diabetes mellitus without complication [facility])     Past Surgical History:  Procedure Laterality Date   PATELLAR TENDON REPAIR Left 02/20/2020   Procedure: PATELLA TENDON REPAIR;  Surgeon: Christena Flake, MD;  Location: [facility] ORS;  Service: Orthopedics;  Laterality: Left;   UMBILICAL HERNIA REPAIR N/A 12/12/2017   Procedure: LAPAROSCOPIC UMBILICAL HERNIA  Repair;  Surgeon: Ovidio Kin, MD;  Location: [facility] ORS;  Service: General;  Laterality: N/A;    Patient Active Problem List   Diagnosis Date Noted   Prolonged QT interval 12/11/2023   Chronic health problem 12/11/2023   Syncope 12/11/2023   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 No pertinent family history.  Social History   Tobacco Use   Smoking status: Never   Smokeless tobacco: Never   Tobacco comments:    Occasional Cigar.   Substance Use Topics   Alcohol use: Yes    Comment: occasionally    Allergies  Allergen Reactions   Iodinated Contrast Media Other (See Comments)    Flexural surface pruritic rash, dizziness and syncope Needs premedication prior to any contrast media   Azithromycin Other (See Comments)  Possible pseudo-anaphylaxis, occurred at the same time as iodine contrast allergy.  Caution against use of macrolides when alternatives are available, as reaction was severe.    Current Meds  Medication Sig   [EXPIRED] azithromycin (ZITHROMAX) 500 MG tablet Take 1 tablet (500 mg total) by mouth daily for 3 days.   [EXPIRED]  ketoconazole (NIZORAL) 2 % shampoo Apply 1 Application topically daily for 7 days. Apply to areas with rash on arms, back and legs   metFORMIN (GLUCOPHAGE-XR) 500 MG 24 hr tablet Take 2 tablets (1,000 mg total) by mouth daily with breakfast.   Multiple Vitamins-Minerals (MULTIVITAMIN WITH MINERALS) tablet Take 1 tablet by mouth daily. Prelox   rosuvastatin (CRESTOR) 10 MG tablet Take 10 mg by mouth daily.     There is no immunization history on file for this patient.      Objective:     BP 132/82 (BP Location: Right Arm, Cuff Size: Large)   Pulse 93   Temp (!) 97.1 F (36.2 C)   Ht 6' (1.829 m)   Wt 256 lb 9.6 oz (116.4 kg)   SpO2 95%   BMI 34.80 kg/m   SpO2: 95 % O2 Device: None (Room air)  GENERAL: Obese, well-developed gentleman, no acute distress. HEAD: Normocephalic, atraumatic.  EYES: Pupils equal, round, reactive to light.  No scleral icterus.  NOSE: Significant turbinate edema, obliteration nasal passages. MOUTH: Some missing teeth, poor dentition overall, oral mucosa moist.  No blood noted in the oral cavity. NECK: Supple. No thyromegaly. Trachea midline. No JVD.  No adenopathy. PULMONARY: Good air entry bilaterally.  No adventitious sounds. CARDIOVASCULAR: S1 and S2. Regular rate and rhythm.  No rubs, murmurs or gallops heard. ABDOMEN: Benign. MUSCULOSKELETAL: No joint deformity, no clubbing, no edema.  NEUROLOGIC: No overt focal deficit, no gait disturbance, speech is fluent.   SKIN: Intact,warm,dry. PSYCH: Mood and behavior normal.      Assessment & Plan:     ICD-10-CM   1. Hemoptysis  R04.2 CT CHEST W CONTRAST    2. Mediastinal adenopathy  R59.0 CT CHEST W CONTRAST    3. Epistaxis  R04.0       Orders Placed This Encounter  Procedures   CT CHEST W CONTRAST    Standing Status:   Future    Number of Occurrences:   1    Expiration Date:   12/02/2024    If indicated for the ordered procedure, I authorize the administration of contrast media per  Radiology protocol:   Yes    Does the patient have a contrast media/X-ray dye allergy?:   No    Preferred imaging location?:   [facility]    Meds ordered this encounter  Medications   azithromycin (ZITHROMAX) 500 MG tablet    Sig: Take 1 tablet (500 mg total) by mouth daily for 3 days.    Dispense:  3 tablet    Refill:  0   Assessment and Plan    Hemoptysis Intermittent hemoptysis for the past two weeks, with episodes of coughing up blood and epistaxis. The hemoptysis is worsening, with increased frequency, it appears to be streaky hemoptysis. He reports nocturnal episodes of coughing and wheezing, resolving after expectoration. Differential diagnosis includes residual infection, bronchial irritation, or other pulmonary pathology. - Order chest CT to evaluate for residual infection or other pulmonary pathology - Prescribe antibiotics for possible sinus infection - Consider bronchoscopy if hemoptysis persists  Sinus congestion Nasal congestion with possible sinus infection contributing to symptoms. He reports epistaxis  and congestion, possibly related to sinus issues. A sinus infection is suspected. 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 Lymph nodes show calcifications, indicating scarring. The condition is quiescent without significant symptoms or complications. Reassured about the benign nature of the condition and its commonality in certain populations.  Follow-up Follow-up is necessary to assess the response to treatment and determine if further investigation is needed for hemoptysis. - Schedule follow-up appointment in 1-2 weeks to review CT results and assess symptoms      Advised if symptoms do not improve or worsen, to please contact office for sooner follow up or seek emergency care.    I spent 60 minutes of dedicated to the care of this patient on the date of this  encounter to include pre-visit review of records, face-to-face time with the patient discussing conditions above, post visit ordering of testing, clinical documentation with the electronic health record, making appropriate referrals as documented, and communicating necessary findings to members of the patients care team.   C. Danice Goltz, MD Advanced Bronchoscopy PCCM [facility]    *This note was dictated using voice recognition software/Dragon.  Despite best efforts to proofread, errors can occur which can change the meaning. Any transcriptional errors that result from this process are unintentional and may not be fully corrected at the time of dictation.

## 2023-12-10 ENCOUNTER — Ambulatory Visit
Admission: RE | Admit: 2023-12-10 | Discharge: 2023-12-10 | Disposition: A | Discharge status: Home / Self Care | Source: Ambulatory Visit | Attending: Pulmonary Disease | Admitting: Pulmonary Disease

## 2023-12-10 ENCOUNTER — Encounter: Payer: Self-pay | Admitting: Pharmacist

## 2023-12-10 ENCOUNTER — Ambulatory Visit: Payer: Self-pay | Admitting: Pharmacist

## 2023-12-10 VITALS — BP 121/67 | Wt 252.6 lb

## 2023-12-10 DIAGNOSIS — J9811 Atelectasis: Secondary | ICD-10-CM | POA: Diagnosis not present

## 2023-12-10 DIAGNOSIS — R042 Hemoptysis: Secondary | ICD-10-CM | POA: Diagnosis present

## 2023-12-10 DIAGNOSIS — E119 Type 2 diabetes mellitus without complications: Secondary | ICD-10-CM

## 2023-12-10 DIAGNOSIS — R590 Localized enlarged lymph nodes: Secondary | ICD-10-CM | POA: Insufficient documentation

## 2023-12-10 LAB — POCT GLYCOSYLATED HEMOGLOBIN (HGB A1C): HbA1c, POC (controlled diabetic range): 10.9 % — AB (ref 0.0–7.0)

## 2023-12-10 MED ORDER — IOHEXOL 300 MG/ML  SOLN
75.0000 mL | Freq: Once | INTRAMUSCULAR | Status: AC | PRN
  Administered 2023-12-10: 75 mL via INTRAVENOUS

## 2023-12-10 NOTE — Patient Instructions (Addendum)
 It was a pleasure seeing you today!  The password for your Josephine Igo App is: [email or web address]  The sensor is small waterproof disc that is placed on the back of the upper arm.  There is a very thin filament that is inserted under the surface of the skin and measures the amount of glucose in the interstitial fluid.  This system collects your sugar levels for up to 14 days and it automatically records the glucose level every 15 minutes. This will show your provider any patterns in your glucose levels.  Please remember... 1. Sensor will last 14 days 2. Sensor should be applied to area away from scarring, tattoos, irritation, and bones. 3. Starting the sensor: 1 hour warm up before BG readings available   4. Libre 3 does not require scanning.  5. Hold reader within 1.5 inches of sensor to scan 6. When the blood drop and magnifying glass symbol appears, test fingerstick blood glucose prior to making treatment decisions 7. Do a fingerstick blood glucose test if the sensor readings do not match how you feel 8. Remove sensor prior to magnetic resonance imaging (MRI), computed tomography (CT) scan, or high-frequency electrical heat (diathermy) treatment. 9. Freestyle Libre may be worn through a Industrial/product designer. It may not be exposed to an advanced Imaging Technology (AIT) body scanner (also called a millimeter wave scanner) or the baggage x-ray machine. Instead, ask for hand-wanding or full-body pat-down and visual inspection.  10. Doses of vitamin C (ascorbic acid) >500 mg every day may cause false high readings. 11. Do not submerge more than 3 feet or keep underwater longer than 30 minutes at a time. Gently pat to dry.  12. Store sensor kit between 39 and 77 degrees Farenheit. Can be refrigerated within this temperature range.  Problems with Freestyle Libre sticking? 1. Order Tegaderm I.V. films to place directly over [facility] sensor on arm. 2. May also order Skin Tac from [facility]. Alcohol  swab area you plan to administer Freestyle Libre then let dry. Once dry, apply Skin Tac in a circular motion (with a spot in the middle for sensor without skin tac) and let dry. Once dry you can apply Freestyle Libre   Problems taking off Freestyle [place]? 1. Remember to try to shower before removing Freestyle Libre 2. Order Tac Away to help remove any extra adhesive left on your skin once you remove Freestyle Libre 3. May also try baby oil to loosen adhesive  Freestyle [facility] Phone number: [phone] Available 7 days a week; excluding holidays 8 AM to 8PM EST  [email or web address]  Please do the following:  Increase/Decrease  as directed today during your appointment. If you have any questions or if you believe something has occurred because of this change, call me or your doctor to let one of [place] know.  Continue checking blood sugars at home. It's really important that you record these and bring these in to your next doctor's appointment.  Continue making the lifestyle changes we've discussed together during our visit. Diet and exercise play a significant role in improving your blood sugars.  Follow-up with me/PCP in 01/07/2024.   Hypoglycemia or low blood sugar:   Low blood sugar can happen quickly and may become an emergency if not treated right away.   While this shouldn't happen often, it can be brought upon if you skip a meal or do not eat enough. Also, if your insulin or other diabetes medications are dosed too high, this can cause your blood  sugar to go to low.   Warning signs of low blood sugar include: Feeling shaky or dizzy Feeling weak or tired  Excessive hunger Feeling anxious or upset  Sweating even when you aren't exercising  What to do if I experience low blood sugar? Follow the Rule of 15 Check your blood sugar. If lower than 70, proceed to step 2.  Treat with 15 grams of fast acting carbs which is found in 3-4 glucose tablets. If none are available  you can try hard candy, 1 tablespoon of sugar or honey,4 ounces of fruit juice, or 6 ounces of REGULAR soda.  Re-check your sugar in 15 minutes. If it is still below 70, do what you did in step 2 again. If your blood sugar has come back up, go ahead and eat a snack or small meal made up of complex carbs (ex. Whole grains) and protein at this time to avoid recurrence of low blood sugar.

## 2023-12-10 NOTE — Progress Notes (Signed)
    S:     Chief Complaint  Patient presents with   Medication Management    Diabetes - Liberate Trial Enrollment   63 y.o. male who presents for diabetes evaluation, education, and management in the context of the LIBERATE Study.   PMH is significant for HLD, T2DM.  Patient was referred and last seen by Primary Care Provider, Dr. Ardyth Harps, on 11/26/2023.   Today, patient arrives in good spirits and presents without any assistance.   Patient reports Diabetes was diagnosed in many years ago (2008).   Family/Social History: Father - diabetes  Current diabetes medications include: metformin 1000 mg (2 X 500 mg tabs) XR daily Current hyperlipidemia medications include: Rosuvastatin 10 mg daily  Patient reports adherence to taking all medications as prescribed.   Insurance coverage: [employer]  O:   Review of Systems  All other systems reviewed and are negative.   Physical Exam Neurological:     Mental Status: He is alert.  Psychiatric:        Mood and Affect: Mood normal.        Thought Content: Thought content normal.        Judgment: Judgment normal.    Lab Results  Component Value Date   HGBA1C 10.9 (A) 12/10/2023    Vitals:   12/10/23 1057  BP: 121/67  SpO2: 98%    Lipid Panel     Component Value Date/Time   CHOL 108 10/21/2023 1604   TRIG 117 10/21/2023 1604   HDL 36 (L) 10/21/2023 1604   CHOLHDL 3.0 10/21/2023 1604   VLDL 23 10/21/2023 1604   LDLCALC 49 10/21/2023 1604   LDLDIRECT 145.1 01/14/2007 0953    Clinical Atherosclerotic Cardiovascular Disease (ASCVD): No  The ASCVD Risk score (Arnett DK, et al., 2019) failed to calculate for the following reasons:   The valid total cholesterol range is 130 to 320 mg/dL    A/P:  LIBERATE Study:  -Patient provided verbal consent to participate in the study. Consent documented in electronic medical record.  -Provided education on Libre 3 CGM. Collaborated to ensure Josephine Igo 3 app was downloaded on patient's  phone. Educated on how to place sensor every 14 days, patient placed first sensor correctly and verbalized understanding of use, removal, and how to place next sensor. Discussed alarms. 8 sensors provided for a 3 month supply. Educated to contact the office if the sensor falls off early and replacements are needed before their next [employer].    Diabetes longstanding currently uncontrolled. Patient is able to verbalize appropriate hypoglycemia management plan. Medication adherence appears adherent.  -Continued metformin 1000 mg XR (2 500 mg XR tabs) daily -Extensively discussed pathophysiology of diabetes, recommended lifestyle interventions, dietary effects on blood sugar control.  -Counseled on s/sx of and management of hypoglycemia.  -Next A1c anticipated at 3 month Liberate study visit.  ASCVD risk - primary secondary prevention in patient with diabetes. Last LDL is  at goal of <70 mg/dL. ASCVD risk factors include T2DM. Moderate-High intensity statin indicated.  -Continued rosuvastatin 10 mg daily   Written patient instructions provided. Patient verbalized understanding of treatment plan.  Total time in face to face counseling 42 minutes.    Follow-up:  Pharmacist 01/07/2024. Patient seen with Mack Guise, PharmD Candidate.

## 2023-12-10 NOTE — Assessment & Plan Note (Signed)
 Diabetes longstanding currently uncontrolled. Patient is able to verbalize appropriate hypoglycemia management plan. Medication adherence appears adherent.  -Continued metformin 1000 mg XR (2 500 mg XR tabs) daily -Extensively discussed pathophysiology of diabetes, recommended lifestyle interventions, dietary effects on blood sugar control.

## 2023-12-11 ENCOUNTER — Other Ambulatory Visit: Payer: Self-pay

## 2023-12-11 ENCOUNTER — Inpatient Hospital Stay (HOSPITAL_COMMUNITY)

## 2023-12-11 ENCOUNTER — Emergency Department (HOSPITAL_COMMUNITY)

## 2023-12-11 ENCOUNTER — Other Ambulatory Visit (HOSPITAL_COMMUNITY): Payer: Self-pay

## 2023-12-11 ENCOUNTER — Observation Stay (HOSPITAL_COMMUNITY)
Admission: EM | Admit: 2023-12-11 | Discharge: 2023-12-12 | Disposition: A | Discharge status: Home / Self Care | Attending: Family Medicine | Admitting: Family Medicine

## 2023-12-11 DIAGNOSIS — E119 Type 2 diabetes mellitus without complications: Secondary | ICD-10-CM | POA: Insufficient documentation

## 2023-12-11 DIAGNOSIS — Z789 Other specified health status: Secondary | ICD-10-CM

## 2023-12-11 DIAGNOSIS — R21 Rash and other nonspecific skin eruption: Secondary | ICD-10-CM | POA: Insufficient documentation

## 2023-12-11 DIAGNOSIS — R000 Tachycardia, unspecified: Secondary | ICD-10-CM

## 2023-12-11 DIAGNOSIS — R55 Syncope and collapse: Secondary | ICD-10-CM | POA: Diagnosis present

## 2023-12-11 DIAGNOSIS — T782XXA Anaphylactic shock, unspecified, initial encounter: Secondary | ICD-10-CM | POA: Diagnosis not present

## 2023-12-11 DIAGNOSIS — R9431 Abnormal electrocardiogram [ECG] [EKG]: Secondary | ICD-10-CM | POA: Insufficient documentation

## 2023-12-11 LAB — I-STAT CG4 LACTIC ACID, ED
Lactic Acid, Venous: 2.5 mmol/L (ref 0.5–1.9)
Lactic Acid, Venous: 1.4 mmol/L (ref 0.5–1.9)

## 2023-12-11 LAB — CBC WITH DIFFERENTIAL/PLATELET
Abs Immature Granulocytes: 0.01 10*3/uL (ref 0.00–0.07)
Basophils Absolute: 0 10*3/uL (ref 0.0–0.1)
Basophils Relative: 0 %
Eosinophils Absolute: 0.1 10*3/uL (ref 0.0–0.5)
Eosinophils Relative: 2 %
HCT: 43.5 % (ref 39.0–52.0)
Hemoglobin: 14.4 g/dL (ref 13.0–17.0)
Immature Granulocytes: 0 %
Lymphocytes Relative: 6 %
Lymphs Abs: 0.4 10*3/uL — ABNORMAL LOW (ref 0.7–4.0)
MCH: 30.7 pg (ref 26.0–34.0)
MCHC: 33.1 g/dL (ref 30.0–36.0)
MCV: 92.8 fL (ref 80.0–100.0)
Monocytes Absolute: 0.2 10*3/uL (ref 0.1–1.0)
Monocytes Relative: 4 %
Neutro Abs: 5.1 10*3/uL (ref 1.7–7.7)
Neutrophils Relative %: 88 %
Platelets: 187 10*3/uL (ref 150–400)
RBC: 4.69 MIL/uL (ref 4.22–5.81)
RDW: 12.1 % (ref 11.5–15.5)
WBC: 5.7 10*3/uL (ref 4.0–10.5)
nRBC: 0 % (ref 0.0–0.2)

## 2023-12-11 LAB — TSH: TSH: 1.542 u[IU]/mL (ref 0.350–4.500)

## 2023-12-11 LAB — CBG MONITORING, ED: Glucose-Capillary: 304 mg/dL — ABNORMAL HIGH (ref 70–99)

## 2023-12-11 LAB — CK: Total CK: 125 U/L (ref 49–397)

## 2023-12-11 LAB — COMPREHENSIVE METABOLIC PANEL WITH GFR
ALT: 41 U/L (ref 0–44)
AST: 45 U/L — ABNORMAL HIGH (ref 15–41)
Albumin: 3.8 g/dL (ref 3.5–5.0)
Alkaline Phosphatase: 55 U/L (ref 38–126)
Anion gap: 12 (ref 5–15)
BUN: 18 mg/dL (ref 8–23)
CO2: 22 mmol/L (ref 22–32)
Calcium: 9.6 mg/dL (ref 8.9–10.3)
Chloride: 101 mmol/L (ref 98–111)
Creatinine, Ser: 1.22 mg/dL (ref 0.61–1.24)
GFR, Estimated: >60 mL/min (ref >60)
Glucose, Bld: 340 mg/dL — ABNORMAL HIGH (ref 70–99)
Potassium: 3.9 mmol/L (ref 3.5–5.1)
Sodium: 135 mmol/L (ref 135–145)
Total Bilirubin: 1.1 mg/dL (ref 0.0–1.2)
Total Protein: 7 g/dL (ref 6.5–8.1)

## 2023-12-11 LAB — GLUCOSE, CAPILLARY
Glucose-Capillary: 561 mg/dL (ref 70–99)
Glucose-Capillary: 497 mg/dL — ABNORMAL HIGH (ref 70–99)
Glucose-Capillary: 470 mg/dL — ABNORMAL HIGH (ref 70–99)
Glucose-Capillary: 487 mg/dL — ABNORMAL HIGH (ref 70–99)

## 2023-12-11 LAB — RESP PANEL BY RT-PCR (RSV, FLU A&B, COVID)  RVPGX2
Influenza A by PCR: NEGATIVE
Influenza B by PCR: NEGATIVE
Resp Syncytial Virus by PCR: NEGATIVE
SARS Coronavirus 2 by RT PCR: NEGATIVE

## 2023-12-11 LAB — I-STAT CHEM 8, ED
BUN: 21 mg/dL (ref 8–23)
Calcium, Ion: 1.18 mmol/L (ref 1.15–1.40)
Chloride: 101 mmol/L (ref 98–111)
Creatinine, Ser: 1.1 mg/dL (ref 0.61–1.24)
Glucose, Bld: 339 mg/dL — ABNORMAL HIGH (ref 70–99)
HCT: 45 % (ref 39.0–52.0)
Hemoglobin: 15.3 g/dL (ref 13.0–17.0)
Potassium: 4.1 mmol/L (ref 3.5–5.1)
Sodium: 137 mmol/L (ref 135–145)
TCO2: 25 mmol/L (ref 22–32)

## 2023-12-11 LAB — GLUCOSE, RANDOM: Glucose, Bld: 572 mg/dL (ref 70–99)

## 2023-12-11 LAB — TROPONIN I (HIGH SENSITIVITY)
Troponin I (High Sensitivity): 6 ng/L (ref <18)
Troponin I (High Sensitivity): 6 ng/L (ref <18)

## 2023-12-11 LAB — PROTIME-INR
INR: 1 (ref 0.8–1.2)
Prothrombin Time: 13.1 s (ref 11.4–15.2)

## 2023-12-11 MED ORDER — ROSUVASTATIN CALCIUM 5 MG PO TABS
10.0000 mg | ORAL_TABLET | Freq: Every day | ORAL | Status: DC
  Administered 2023-12-11 – 2023-12-12 (×2): 10 mg via ORAL
  Filled 2023-12-11 (×2): qty 2

## 2023-12-11 MED ORDER — EPINEPHRINE 0.3 MG/0.3ML IJ SOAJ
0.3000 mg | Freq: Once | INTRAMUSCULAR | Status: AC
  Administered 2023-12-11: 0.3 mg via INTRAMUSCULAR

## 2023-12-11 MED ORDER — INSULIN GLARGINE-YFGN 100 UNIT/ML ~~LOC~~ SOPN: 5.0000 [IU] | PEN_INJECTOR | SUBCUTANEOUS | Status: DC

## 2023-12-11 MED ORDER — EPINEPHRINE 0.3 MG/0.3ML IJ SOAJ
INTRAMUSCULAR | Status: AC
  Filled 2023-12-11: qty 0.3

## 2023-12-11 MED ORDER — SODIUM CHLORIDE 0.9 % IV BOLUS
500.0000 mL | Freq: Once | INTRAVENOUS | Status: AC
  Administered 2023-12-11: 500 mL via INTRAVENOUS

## 2023-12-11 MED ORDER — LORATADINE 10 MG PO TABS
10.0000 mg | ORAL_TABLET | Freq: Every day | ORAL | 0 refills | Status: AC
Start: 2023-12-12 — End: ?
  Filled 2023-12-11: qty 30, 30d supply, fill #0

## 2023-12-11 MED ORDER — ACETAMINOPHEN 325 MG PO TABS
650.0000 mg | ORAL_TABLET | Freq: Once | ORAL | Status: AC
Start: 2023-12-11 — End: 2023-12-11
  Administered 2023-12-11: 650 mg via ORAL
  Filled 2023-12-11: qty 2

## 2023-12-11 MED ORDER — INSULIN ASPART 100 UNIT/ML IJ SOLN
10.0000 [IU] | Freq: Once | INTRAMUSCULAR | Status: AC
  Administered 2023-12-11: 10 [IU] via SUBCUTANEOUS

## 2023-12-11 MED ORDER — ACETAMINOPHEN 650 MG RE SUPP: 650.0000 mg | Freq: Four times a day (QID) | RECTAL | Status: DC | PRN

## 2023-12-11 MED ORDER — INSULIN GLARGINE-YFGN 100 UNIT/ML ~~LOC~~ SOLN
5.0000 [IU] | SUBCUTANEOUS | Status: DC
Start: 2023-12-11 — End: 2023-12-12
  Administered 2023-12-11: 5 [IU] via SUBCUTANEOUS
  Filled 2023-12-11 (×2): qty 0.05

## 2023-12-11 MED ORDER — INSULIN ASPART 100 UNIT/ML IJ SOLN
0.0000 [IU] | Freq: Three times a day (TID) | INTRAMUSCULAR | Status: DC
  Administered 2023-12-12: 9 [IU] via SUBCUTANEOUS

## 2023-12-11 MED ORDER — ACETAMINOPHEN 325 MG PO TABS: 650.0000 mg | ORAL_TABLET | Freq: Four times a day (QID) | ORAL | Status: DC | PRN

## 2023-12-11 MED ORDER — POLYETHYLENE GLYCOL 3350 17 G PO PACK: 17.0000 g | PACK | Freq: Every day | ORAL | Status: DC | PRN

## 2023-12-11 MED ORDER — LORATADINE 10 MG PO TABS
10.0000 mg | ORAL_TABLET | Freq: Every day | ORAL | Status: DC
  Administered 2023-12-11 – 2023-12-12 (×2): 10 mg via ORAL
  Filled 2023-12-11 (×2): qty 1

## 2023-12-11 NOTE — Progress Notes (Signed)
 Physical Therapy Note  Screened by physical therapy this morning, after stating he feels pretty close to baseline again. Completed both BERG and DGI without loss of balance, both scores indicating low fall risk, no assistive device recommended. HR 114 at rest, 125 while ambulating. Denies SOB, chest pain, dizziness, or other concerning symptoms with activity. Patient is functioning at a high level of independence and no physical therapy is indicated at this time. PT is signing-off. Please re-order if there is any significant change in status. Thank you for this referral.   BERG 54/56 DGI 22/24  Kathlyn Sacramento, PT, DPT [facility]  Rehabilitation Services Physical Therapist Office: [phone] Website: [email or web address]

## 2023-12-11 NOTE — Assessment & Plan Note (Addendum)
 Multitude of possible causes at this point. Pt did have syncopal events surrounding his last hospitalization in Feb 2025 for PNA. Will work up for broad differential as above. -Admit to Med Tele with FMTS, Dr. Jennette Kettle attending -Monitor orthostatic vitals -PT/OT eval and treat -Fall precautions -Follow-up TSH, CK, PT-INR, C3/C4 complement, ANA - AM CBC, BMP -Encourage good PO intake -CT head wo contrast -consider EEG/Neuro consult

## 2023-12-11 NOTE — Assessment & Plan Note (Addendum)
 Not present on last EKG Feb 2025. - avoid Qtc prolonging medications as able - repeat EKG if these meds are necessary

## 2023-12-11 NOTE — Hospital Course (Addendum)
 Keith Larsen is a 63 year old male who presented with episode of syncope, pruritus, rash.  His hospital course outlined below.  Syncope Reassuringly, CT imaging of head normal.  No witnessed seizure activity, no history of seizures.  Lab workup including electrolytes, CK, TSH, ANA, complements, all unremarkable *** .  Troponins were negative, EKG not concerning for ACS.  Recent echocardiogram in February, not concerning for CHF nor significant valvular disease.  At this time, believe syncope related to pseudo-anaphylaxis (not immunologic anaphylaxis) related to a drug reaction from iodinated contrast media (see below).  OT/PT evaluated, patient at baseline and functional.  Discharged home in stable good condition.  Maculopapular rash Prior history in February of desquamating pruritic rash on flexor surfaces of arms and legs 3-4 days after receiving IV contrast.  This resolved.  Patient received IV contrast on 3/28, and then developed a maculopapular rash on the flexor surfaces of arms and legs approximately 8 hours after receiving contrast.  Rash is nonpainful, no longer pruritic. Additionally had symptoms of anaphylaxis which is what brought him to the ED, he received 1 dose of epinephrine/IV Solu-Medrol and Benadryl.  Anaphylactic symptoms resolved rapidly, however maculopapular rash remains.  At this time we believe patient has had a symmetrical drug-related intertriginous and flexural exanthem (SDRIFE) related to IV contrast and possible pseudo-anaphylaxis.  Would recommend avoiding IV contrast in the future, or premedicating if absolute necessary.  Iodinated contrast media has been added to patient's allergy list.  Type 2 diabetes Blood glucose of 300 at admission.  Home regimen includes metformin.  Reports not taking insulin at home, did require insulin during his last hospitalization.  On 12/11/2023 patient developed hyperglycemia >500, he received 10 units of NovoLog and***.  Other chronic  conditions were medically managed with home medications and formulary alternatives as necessary (T2DM)  Follow-up recommendations Ensure maculopapular rash is improving and allergic symptoms are resolved. CBC/BMP/UA if any concern for renal involvement.

## 2023-12-11 NOTE — Plan of Care (Signed)

## 2023-12-11 NOTE — Plan of Care (Addendum)
 FMTS Interim Progress Note  S:  Went to evaluate patient at bedside.  He feels better today. On further questioning patient had a similar rash that presented 3-4 days after receiving IV contrast during his hospitalization in February.  The rash was located on his flexural surfaces of arm and legs, was pruritic and peeling.  Additionally, patient endorses starting a new antibiotic-does not have the name of this antibiotic nor is it located in his chart.  States he has been taking this for several days.  States he does not believe he is at his medication prior.  Family will find prescription at home, and relay to primary team.  The progression of his symptoms are as follows. Morning felt fine > CT scan approximately 3:30 PM > symptom onset approximately 11 PM, included: Severe pruritus on chest/neck, nausea vomiting, weakness, development of rash on arms/legs, syncope.  O: BP 133/85 (BP Location: Left Arm)   Pulse (!) 113   Temp 98 F (36.7 C) (Oral)   Resp 18   SpO2 90%    General: NAD, well-appearing, resting in bed Cardio: RRR, no murmurs Respiratory: Bilateral crackles in bases, CTAB upper lobes.  Normal work of breathing on room air. Skin: Nonblanching, maculopapular, nonpruritic, nontender, located over flexural surfaces of upper extremities, and flexural surfaces of lower extremities.      A/P: Maculopapular rash  Syncopal event Highly suspicious for drug hypersensitivity to radiocontrast agent.  Particular concerning given he had a minor rash distribution in the same location in February approximately 3-4 days after receiving contrast. Timing and distribution of maculopapular rash is similar to Symmetrical drug-related intertriginous and flexural exanthem (SDRIFE)-which is classically a type IV reaction.  However, did have pruritic symptoms, nausea/vomiting, and syncopal event- symptoms improved with epinephrine.  Unclear if truly an IgE mediated response (normal eosinophil count)  versus nonimmunologic anaphylaxis event (which is common w/ contrast, but usually occurs sooner than this patient had presented). Both situations would explain syncopal event. - Pending CT head (without contrast) - Pending additional lab work - Will add radiocontrast to patient's allergy list, I question utility in premedication prior to imaging given unclear IgE mediation. - Consider topical steroid   Tiffany Kocher, DO 12/11/2023, 8:50 AM PGY-2, [facility] Family Medicine Service pager [phone]

## 2023-12-11 NOTE — Discharge Instructions (Addendum)
 Dear Ginette Pitman,  Thank you for letting us participate in your care.  POST-HOSPITAL & CARE INSTRUCTIONS Please take loratadine 10 mg daily for the next 7-14 days to help with allergic symptoms We have added IV iodinated contrast media to your allergy list.  We recommend avoiding contrast in the future.  If an emergency circumstance occurs or you need contrast, we would recommend premedication with Benadryl prior to receiving contrast to prevent allergic reactions. Please follow-up with your pulmonologist as scheduled Go to your follow up appointments (listed below)   DOCTOR'S APPOINTMENT   Future Appointments  Date Time Provider Department Center  12/15/2023  9:50 AM Lincoln Brigham, MD [facility]  12/17/2023 11:30 AM Salena Saner, MD [facility] None  01/07/2024 10:00 AM Kathrin Ruddy, RPH-CPP [facility]  01/28/2024  9:30 AM Ashok Croon, MD [facility] None    Follow-up Information     Lincoln Brigham, MD. Go on 12/15/2023.   Specialty: Family Medicine Contact information: [address] [place] [address] [phone]                 Take care and be well!  Family Medicine Teaching Service Inpatient Team [place]  [facility]  [address] [place], [place] [address] [phone]

## 2023-12-11 NOTE — Plan of Care (Signed)
 FMTS Brief Progress Note  S: Patient with no complaints tonight, states itching has resolved and he is feeling well. Discussed high blood sugar and insulin.  Patient is hesitant to be discharged home on insulin regimen and would prefer to learn more about GLP-1   O: BP (!) 147/78 (BP Location: Left Arm)   Pulse (!) 109   Temp 98.4 F (36.9 C) (Oral)   Resp 16   SpO2 93%   Well-appearing, NAD Well-perfused and breathing comfortably on room air   A/P: Hypersensitivity reaction S/p steroids, benadryl, epi, IV fluids and doing well  -CTM  Prolonged QTc -Repeat EKG in a.m.  T2DM Uncontrolled with CBG as high as 561 earlier today.  Taking metformin at home. A1c 10.5 last month.  S/p 20 units short acting this evening -Semglee 5 units tonight -Sensitive SSI -Consider Ozempic at discharge    Erick Alley, DO 12/11/2023, 10:12 PM PGY-3, [facility] Family Medicine Night Resident  Please page [phone] with questions.

## 2023-12-11 NOTE — Assessment & Plan Note (Addendum)
 T2DM: holding home metformin

## 2023-12-11 NOTE — H&P (Addendum)
 Hospital Admission History and Physical Service Pager: [phone]  Patient name: Keith Larsen Medical record number: [id number] Date of Birth: 08/13/1961 Age: 63 y.o. Gender: male  Primary Care Provider: Elberta Fortis, MD Consultants: none Code Status: Full Preferred Emergency Contact:   Contact Information     Name Relation Home Work Mobile   Kalis,Annette Spouse [phone]  [phone]      Other Contacts   None on File      Chief Complaint: syncope  Assessment and Plan: Keith Larsen is a 63 y.o. male presenting with syncope/near syncopal episodes x2, as well as new onset diffuse pruritus, urticaria. Differential for presentation of this is broad and includes TIA/stroke, seizure, arrhythmia, PE, bacteremia/sepsis, anaphylaxis.  - consider possible anaphylaxis vs hypersensitivity reaction given this occurred after IV contrast for CT. - less likely CV causes (arrhythmia, ACS) with negative troponins, EKG notable for new prolonged QT. Last Echo (Feb 2025) was grossly normal. - less likely PE given no evidence for DVT - pt does not meet sepsis criteria, low suspicion for bacteremia in afebrile pt without leukocytosis.  Assessment & Plan Syncope Multitude of possible causes at this point. Pt did have syncopal events surrounding his last hospitalization in Feb 2025 for PNA. Will work up for broad differential as above. -Admit to Med Tele with FMTS, Dr. Jennette Kettle attending -Monitor orthostatic vitals -PT/OT eval and treat -Fall precautions -Follow-up TSH, CK, PT-INR, C3/C4 complement, ANA - AM CBC, BMP -Encourage good PO intake -CT head wo contrast -consider EEG/Neuro consult Prolonged QT interval Not present on last EKG Feb 2025. - avoid Qtc prolonging medications as able - repeat EKG if these meds are necessary Chronic health problem T2DM: holding home metformin  FEN/GI: Carb modified diet VTE Prophylaxis: SCDs  Disposition: Med Tele  History  of Present Illness:  Keith Larsen is a 63 y.o. male presenting with x2 syncopal vs near-syncopal events earlier this evening.  He was feeling completely normal when he got up yesterday morning, had doctor's appt without any new medications or other changes, then had a CT scan in the afternoon to follow up prior PNA. He helped his wife do some yard work spreading mulch.  Around 11pm, started to feel warm and itchy around his neck. Then he started to get chills and was shaking, had to bundle under blanket. He felt nauseous and got up to go to the bathroom but passed out (wife witnessed and helped lower him to floor). He did not hit his head. He was "out" for 1-2 minutes. Pt and wife both describe "shaking like shivering" before passing out and this continued while he was passed out.   EMS was called and witnessed a second syncopal episode.  Pt denies any new exposures to chemicals in the yard, bug bites, medications. No abdominal pain, change in appetite, changes in bowel/bladder habits.  He also reports that after his last admission in Feb he experienced diffuse peeling skin and itching on legs, arms, thighs. It was red and itchy similar to how it is now. He started to use a shampoo rxd by PCP, and the peeling went away   In the ED, labs showed lactic acid elevated to 2.5 which resolved on repeat, troponins negative, CBC/CMP unremarkable. CXR with possible atelectasis versus infection in the lung bases. Pt received epinephrine and IVF bolus and was called for admission.  Review Of Systems: Per HPI.  Pertinent Past Medical History: T2DM Remainder reviewed in history tab.  Pertinent Past Surgical History: Umbilical hernia repair - 2019 Remainder reviewed in history tab.   Pertinent Social History: Tobacco use: no Alcohol use: no Other Substance use: no Lives with wife, son  Pertinent Family History: None Remainder reviewed in history tab.   Important Outpatient  Medications: Crestor 10 mg daily Metformin 1000 mg daily Remainder reviewed in medication history.   Objective: BP 135/68   Pulse (!) 109   Temp 99.2 F (37.3 C) (Oral)   Resp (!) 22   SpO2 96%  Exam: General: tired-appearing, no acute distress. HEENT: normocephalic, PERRLA, EOM grossly intact, MMM. Cardio: Tachycardic, regular rhythm, no murmurs on exam. Pulm: Clear, no wheezing, no crackles. No increased work of breathing on 2L via [place]. Abdominal: bowel sounds present, soft, non-tender, non-distended. Extremities: no peripheral edema. Moves all extremities equally. Psych:  Cognition and judgment appear intact. Alert, communicative, and cooperative with normal attention span and concentration. Neuro: CN II: PERRL CN III, IV,VI: EOMI CV V: Normal sensation in V1, V2, V3 CVII: Symmetric smile and brow raise CN VIII: Normal hearing CN IX,X: Symmetric palate raise  CN XI: 5/5 shoulder shrug CN XII: Symmetric tongue protrusion  UE and LE strength 5/5 Normal sensation in UE and LE bilaterally  SKIN maculopapular erythematous areas flexural surface of forearms bilaterally (see photo in media tab)  Labs:  CBC BMET  Recent Labs  Lab 12/11/23 0202 12/11/23 0212  WBC 5.7  --   HGB 14.4 15.3  HCT 43.5 45.0  PLT 187  --    Recent Labs  Lab 12/11/23 0202 12/11/23 0212  NA 135 137  K 3.9 4.1  CL 101 101  CO2 22  --   BUN 18 21  CREATININE 1.22 1.10  GLUCOSE 340* 339*  CALCIUM 9.6  --      Lactic acid 2.5 > 1.4 Troponin 6 > 6  EKG: Tachycardic. P waves present, no widening of QRS interval. Qtc prolonged.   3/29 CXR: IMPRESSION: Low volume chest with hazy density at the bases that could be atelectasis or infection.  Cyndia Skeeters, DO 12/11/2023, 6:24 AM PGY-1, [facility] Family Medicine  FPTS Intern pager: [phone], text pages welcome Secure chat group [facility] Teaching Service

## 2023-12-11 NOTE — ED Provider Notes (Signed)
 [facility] EMERGENCY DEPARTMENT AT [facility] Provider Note   CSN: Larsen Arrival date & time: 12/11/23  0155     History  Chief Complaint  Patient presents with   Loss of Consciousness   Allergic Reaction    Keith Larsen is a 63 y.o. male.  The history is provided by the patient, medical records and the spouse.  Loss of Consciousness Allergic Reaction Keith Larsen is a 63 y.o. male who presents to the Emergency Department complaining of syncope.  He presents to the emergency department for evaluation of syncope.  He was feeling in his routine state of health earlier today.  He did have a contrasted CT scan around 4 PM for follow-up for pneumonia.  He states that around 11 PM he developed diffuse itching and started to feel unwell.  He was walking down the hall to use the bathroom and required assistance by his wife and he became lightheaded and she assisted him to the floor and he briefly passed out.  This lasted for a few seconds.  On EMS arrival he had a second brief episode of syncope when he went to vomit.  He denies any chest pain, difficulty breathing, abdominal pain.  Wife states that he does appear more short of breath than baseline.  EMS gave him 50 mg of p.o. Benadryl as well as 125 mg of Solu-Medrol for diffuse urticaria.      Home Medications Prior to Admission medications   Medication Sig Start Date End Date Taking? Authorizing Provider  metFORMIN (GLUCOPHAGE-XR) 500 MG 24 hr tablet Take 2 tablets (1,000 mg total) by mouth daily with breakfast. 11/29/23  Yes Elberta Fortis, MD  Multiple Vitamins-Minerals (MULTIVITAMIN WITH MINERALS) tablet Take 1 tablet by mouth daily. Prelox   Yes [provider]  rosuvastatin (CRESTOR) 10 MG tablet Take 10 mg by mouth daily. 09/07/23  Yes [provider]      Allergies    Patient has no known allergies.    Review of Systems   Review of Systems  Cardiovascular:  Positive for syncope.   All other systems reviewed and are negative.   Physical Exam Updated Vital Signs BP (!) 140/83 (BP Location: Left Arm)   Pulse (!) 110   Temp 99.1 F (37.3 C) (Oral)   Resp 20   SpO2 91%  Physical Exam Vitals and nursing note reviewed.  Constitutional:      Appearance: He is well-developed.  HENT:     Head: Normocephalic and atraumatic.  Cardiovascular:     Rate and Rhythm: Regular rhythm. Tachycardia present.     Heart sounds: No murmur heard. Pulmonary:     Comments: Tachypnea, slightly decreased air movement bilaterally Abdominal:     Palpations: Abdomen is soft.     Tenderness: There is no abdominal tenderness. There is no guarding or rebound.  Musculoskeletal:        General: No tenderness.  Skin:    General: Skin is dry.     Comments: Diffuse urticaria of bilateral upper extremities, chest, bilateral upper extremities.  Cool hands, feet  Neurological:     Mental Status: He is alert and oriented to person, place, and time.  Psychiatric:        Behavior: Behavior normal.     ED Results / Procedures / Treatments   Labs (all labs ordered are listed, but only abnormal results are displayed) Labs Reviewed  COMPREHENSIVE METABOLIC PANEL WITH GFR - Abnormal; Notable for the following components:  Result Value   Glucose, Bld 340 (*)    AST 45 (*)    All other components within normal limits  CBC WITH DIFFERENTIAL/PLATELET - Abnormal; Notable for the following components:   Lymphs Abs 0.4 (*)    All other components within normal limits  CBG MONITORING, ED - Abnormal; Notable for the following components:   Glucose-Capillary 304 (*)    All other components within normal limits  I-STAT CHEM 8, ED - Abnormal; Notable for the following components:   Glucose, Bld 339 (*)    All other components within normal limits  I-STAT CG4 LACTIC ACID, ED - Abnormal; Notable for the following components:   Lactic Acid, Venous 2.5 (*)    All other components within normal  limits  RESP PANEL BY RT-PCR (RSV, FLU A&B, COVID)  RVPGX2  CULTURE, BLOOD (ROUTINE X 2)  CULTURE, BLOOD (ROUTINE X 2)  PROTIME-INR  TSH  CK  ANA  C3 COMPLEMENT  C4 COMPLEMENT  I-STAT CG4 LACTIC ACID, ED  TROPONIN I (HIGH SENSITIVITY)  TROPONIN I (HIGH SENSITIVITY)    EKG None  Radiology DG Chest Port 1 View Result Date: 12/11/2023 CLINICAL DATA:  Shortness of breath EXAM: PORTABLE CHEST 1 VIEW COMPARISON:  10/20/2018 FINDINGS: Low volume chest with diffuse interstitial opacity and hazy density. No visible effusion or air leak. Borderline heart size, accentuated by technique. Extensive artifact from EKG leads. IMPRESSION: Low volume chest with hazy density at the bases that could be atelectasis or infection. Electronically Signed   By: Tiburcio Pea M.D.   On: 12/11/2023 04:07    Procedures Procedures   CRITICAL CARE Performed by: Tilden Fossa   Total critical care time: 35 minutes  Critical care time was exclusive of separately billable procedures and treating other patients.  Critical care was necessary to treat or prevent imminent or life-threatening deterioration.  Critical care was time spent personally by me on the following activities: development of treatment plan with patient and/or surrogate as well as nursing, discussions with consultants, evaluation of patient's response to treatment, examination of patient, obtaining history from patient or surrogate, ordering and performing treatments and interventions, ordering and review of laboratory studies, ordering and review of radiographic studies, pulse oximetry and re-evaluation of patient's condition.  Medications Ordered in ED Medications  rosuvastatin (CRESTOR) tablet 10 mg (has no administration in time range)  acetaminophen (TYLENOL) tablet 650 mg (has no administration in time range)    Or  acetaminophen (TYLENOL) suppository 650 mg (has no administration in time range)  polyethylene glycol (MIRALAX /  GLYCOLAX) packet 17 g (has no administration in time range)  EPINEPHrine (EPI-PEN) injection 0.3 mg (0 mg Intramuscular Hold 12/11/23 0203)  acetaminophen (TYLENOL) tablet 650 mg (650 mg Oral Given 12/11/23 0242)  sodium chloride 0.9 % bolus 500 mL (0 mLs Intravenous Stopped 12/11/23 0431)    ED Course/ Medical Decision Making/ A&P                                 Medical Decision Making Amount and/or Complexity of Data Reviewed Labs: ordered. Radiology: ordered.  Risk OTC drugs. Prescription drug management. Decision regarding hospitalization.   Patient with history of diabetes, cirrhosis, pulmonary hypertension here for evaluation following syncopal event x 2.  He does have significant urticaria on examination, tachypnea.  Concern for possible anaphylaxis and he was treated with IM epi.  He does report improvement on his symptoms on repeat  assessment.  He does have persistent erythema to bilateral arms but urticaria has resolved.  He is persistently tachycardic in the emergency department.  On repeat lung exam he has fine crackles in the bases.  He is febrile to 100.9.  His fever did improve with acetaminophen administration but his tachycardia did not.  No definite infectious process at this time.  Unclear etiology of his fever.  Given his constellation of symptoms and persistent tachycardia medicine consulted for admission for ongoing care.  Unclear if this was a reaction to IV contrast versus an alternative process.        Final Clinical Impression(s) / ED Diagnoses Final diagnoses:  Anaphylaxis, initial encounter  Sinus tachycardia  Syncope, unspecified syncope type    Rx / DC Orders ED Discharge Orders     None         Tilden Fossa, MD 12/11/23 [phone]

## 2023-12-11 NOTE — Progress Notes (Signed)
 OT Cancellation Note  Patient Details Name: Keith Larsen MRN: [id number] DOB: 07-23-1961   Cancelled Treatment:    Reason Eval/Treat Not Completed: OT screened per PT, no needs identified, will sign off  Emelda Fear 12/11/2023, 10:25 AM  Nyoka Cowden OTR/L Acute Rehabilitation Services Office: [phone]

## 2023-12-11 NOTE — Plan of Care (Addendum)
 Went to speak with patient, they brought an antibiotic.  Unfortunately, patient was taking azithromycin-he did receive this medication during his last hospitalization as well as most recently before receiving his IV contrast.  This further complicates which medication could be causing his drug reaction.  Feel very strongly this is a drug reaction based on his symptomology.  Out of abundance of precaution we will put macrolide and IV contrast on his allergy list.  Still would recommend premedication before receiving contrast in the future.

## 2023-12-11 NOTE — ED Triage Notes (Signed)
 Pt BIB [facility] from home d/t concerns for syncopal episodes x 2 this morning. Sts he needed assistance to get up to the bathroom after feeling dizzy and itchy all over. Has a diffused redness. Sts he had a CT with contrast earlier yesterday.  EMS gave 125 soul medrol IV and 50 PO benadryl. Pt denies SOB and CP.

## 2023-12-12 ENCOUNTER — Other Ambulatory Visit: Payer: Self-pay

## 2023-12-12 DIAGNOSIS — T782XXD Anaphylactic shock, unspecified, subsequent encounter: Secondary | ICD-10-CM

## 2023-12-12 DIAGNOSIS — R55 Syncope and collapse: Secondary | ICD-10-CM | POA: Diagnosis not present

## 2023-12-12 DIAGNOSIS — R9431 Abnormal electrocardiogram [ECG] [EKG]: Secondary | ICD-10-CM

## 2023-12-12 DIAGNOSIS — E119 Type 2 diabetes mellitus without complications: Secondary | ICD-10-CM | POA: Diagnosis not present

## 2023-12-12 DIAGNOSIS — T782XXA Anaphylactic shock, unspecified, initial encounter: Secondary | ICD-10-CM | POA: Diagnosis not present

## 2023-12-12 DIAGNOSIS — R21 Rash and other nonspecific skin eruption: Secondary | ICD-10-CM | POA: Diagnosis not present

## 2023-12-12 LAB — BASIC METABOLIC PANEL WITH GFR
Anion gap: 10 (ref 5–15)
BUN: 22 mg/dL (ref 8–23)
CO2: 26 mmol/L (ref 22–32)
Calcium: 9.6 mg/dL (ref 8.9–10.3)
Chloride: 98 mmol/L (ref 98–111)
Creatinine, Ser: 1.3 mg/dL — ABNORMAL HIGH (ref 0.61–1.24)
GFR, Estimated: >60 mL/min (ref >60)
Glucose, Bld: 390 mg/dL — ABNORMAL HIGH (ref 70–99)
Potassium: 4.3 mmol/L (ref 3.5–5.1)
Sodium: 134 mmol/L — ABNORMAL LOW (ref 135–145)

## 2023-12-12 LAB — ANA: Anti Nuclear Antibody (ANA): NEGATIVE

## 2023-12-12 LAB — GLUCOSE, CAPILLARY
Glucose-Capillary: 380 mg/dL — ABNORMAL HIGH (ref 70–99)
Glucose-Capillary: 448 mg/dL — ABNORMAL HIGH (ref 70–99)

## 2023-12-12 LAB — C3 COMPLEMENT: C3 Complement: 128 mg/dL (ref 82–167)

## 2023-12-12 LAB — CBC
HCT: 37.5 % — ABNORMAL LOW (ref 39.0–52.0)
Hemoglobin: 12.6 g/dL — ABNORMAL LOW (ref 13.0–17.0)
MCH: 30.4 pg (ref 26.0–34.0)
MCHC: 33.6 g/dL (ref 30.0–36.0)
MCV: 90.6 fL (ref 80.0–100.0)
Platelets: 185 10*3/uL (ref 150–400)
RBC: 4.14 MIL/uL — ABNORMAL LOW (ref 4.22–5.81)
RDW: 12.2 % (ref 11.5–15.5)
WBC: 15.2 10*3/uL — ABNORMAL HIGH (ref 4.0–10.5)
nRBC: 0 % (ref 0.0–0.2)

## 2023-12-12 LAB — C4 COMPLEMENT: Complement C4, Body Fluid: 26 mg/dL (ref 12–38)

## 2023-12-12 MED ORDER — INSULIN ASPART 100 UNIT/ML IJ SOLN
10.0000 [IU] | Freq: Once | INTRAMUSCULAR | Status: AC
  Administered 2023-12-12: 10 [IU] via SUBCUTANEOUS

## 2023-12-12 NOTE — Plan of Care (Signed)

## 2023-12-12 NOTE — Discharge Summary (Signed)
 Family Medicine Teaching [facility] Discharge Summary  Patient name: Keith Larsen Medical record number: [id number] Date of birth: [birth year] Age: 63 y.o. Gender: male Date of Admission: 12/11/2023  Date of Discharge: 12/12/2023 Admitting Physician: Cyndia Skeeters, DO  Primary Care Provider: Elberta Fortis, MD Consultants: None  Indication for Hospitalization: Syncope  Brief Hospital Course:  CRUE OTERO is a 63 year old male who presented with episode of syncope, pruritus, rash.  His hospital course outlined below.  Syncope Reassuringly, CT imaging of head normal.  No witnessed seizure activity, no history of seizures.  Lab workup including electrolytes, CK, TSH all unremarkable.  ANA, complements pending at time of discharge. Troponins were negative, EKG not concerning for ACS.  Recent echocardiogram in February, not concerning for CHF nor significant valvular disease.  At this time, believe syncope related to pseudo-anaphylaxis (not immunologic anaphylaxis) related to a drug reaction from iodinated contrast media (see below).  OT/PT evaluated, patient at baseline and functional.  Discharged home in stable condition.  Maculopapular rash Prior history in February of desquamating pruritic rash on flexor surfaces of arms and legs 3-4 days after receiving IV contrast.  This resolved.  Patient received IV contrast on 3/28, and then developed a maculopapular rash on the flexor surfaces of arms and legs approximately 8 hours after receiving contrast.  Rash is nonpainful, no longer pruritic. Additionally had symptoms of anaphylaxis which is what brought him to the ED, he received 1 dose of epinephrine/IV Solu-Medrol and Benadryl.  Anaphylactic symptoms resolved rapidly, however maculopapular rash remains.  At this time we believe patient has had a symmetrical drug-related intertriginous and flexural exanthem (SDRIFE) related to IV contrast and possible pseudo-anaphylaxis.  Would  recommend avoiding IV contrast in the future, or premedicating if absolute necessary.  Iodinated contrast media has been added to patient's allergy list.  Type 2 diabetes Blood glucose of 300 at admission.  Home regimen includes metformin.  Reports not taking insulin at home, did require insulin during his last hospitalization.  On 12/11/2023 patient developed hyperglycemia >500, he received 10 units of NovoLog and had SSI available while inpatient.  Other chronic conditions were medically managed with home medications and formulary alternatives as necessary (T2DM)  Follow-up recommendations Ensure maculopapular rash is improving and allergic symptoms are resolved. CBC/BMP/UA if any concern for renal involvement. Consider ozempic. Follow up complement and ANA labs from hospitalization. Follow up lab with CBC and BMP.    Discharge Diagnoses/Problem List:  Active Problems:   Prolonged QT interval   Chronic health problem   Syncope   Anaphylactic syndrome  Disposition: Home  Discharge Condition: Stable  Discharge Exam: General: Well-appearing, sitting up in chair at bedside, NAD Cardiovascular: RRR, no murmurs Respiratory: CTA bilaterally, normal work of breathing on room air Extremities: Unchanged nonblanching, maculopapular rash flexural surfaces of bilateral upper extremities.  Significant Procedures: None  Significant Labs and Imaging:  Recent Labs  Lab 12/11/23 0202 12/11/23 0212 12/12/23 0703  WBC 5.7  --  15.2*  HGB 14.4 15.3 12.6*  HCT 43.5 45.0 37.5*  PLT 187  --  185   Recent Labs  Lab 12/11/23 0202 12/11/23 0212 12/11/23 1742 12/12/23 0703  NA 135 137  --  134*  K 3.9 4.1  --  4.3  CL 101 101  --  98  CO2 22  --   --  26  GLUCOSE 340* 339* 572* 390*  BUN 18 21  --  22  CREATININE 1.22 1.10  --  1.30*  CALCIUM 9.6  --   --  9.6  ALKPHOS 55  --   --   --   AST 45*  --   --   --   ALT 41  --   --   --   ALBUMIN 3.8  --   --   --     3/29  CXR: IMPRESSION: Low volume chest with hazy density at the bases that could be atelectasis or infection.  3/29 CT Head WO contrast: IMPRESSION: Negative head CT.  Results/Tests Pending at Time of Discharge:  C3 complement, C4 complement, ANA  Discharge Medications:  Allergies as of 12/12/2023       Reactions   Iodinated Contrast Media Other (See Comments)   Flexural surface pruritic rash, dizziness and syncope Needs premedication prior to any contrast media   Azithromycin Other (See Comments)   Possible pseudo-anaphylaxis, occurred at the same time as iodine contrast allergy.  Caution against use of macrolides when alternatives are available, as reaction was severe.        Medication List     TAKE these medications    loratadine 10 MG tablet Commonly known as: CLARITIN Take 1 tablet (10 mg total) by mouth daily.   metFORMIN 500 MG 24 hr tablet Commonly known as: GLUCOPHAGE-XR Take 2 tablets (1,000 mg total) by mouth daily with breakfast.   multivitamin with minerals tablet Take 1 tablet by mouth daily. Prelox   rosuvastatin 10 MG tablet Commonly known as: CRESTOR Take 10 mg by mouth daily.        Discharge Instructions: Please refer to Patient Instructions section of EMR for full details.  Patient was counseled important signs and symptoms that should prompt return to medical care, changes in medications, dietary instructions, activity restrictions, and follow up appointments.   Follow-Up Appointments:  Follow-up Information     Lincoln Brigham, MD. Go on 12/15/2023.   Specialty: Family Medicine Contact information: [address] [place] [address] [phone]                 Cyndia Skeeters, DO 12/12/2023, 11:47 AM PGY-1, [facility] Family Medicine

## 2023-12-13 ENCOUNTER — Encounter: Payer: Self-pay | Admitting: Pulmonary Disease

## 2023-12-13 ENCOUNTER — Telehealth: Payer: Self-pay

## 2023-12-13 ENCOUNTER — Other Ambulatory Visit (HOSPITAL_COMMUNITY): Payer: Self-pay

## 2023-12-13 NOTE — Transitions of Care (Post Inpatient/ED Visit) (Unsigned)
   12/13/2023  Name: Keith Larsen MRN: [id number] DOB: [birth year]  Today's TOC FU Call Status: Today's TOC FU Call Status:: Unsuccessful Call (1st Attempt) Unsuccessful Call (1st Attempt) Date: 12/13/23  Attempted to reach the patient regarding the most recent Inpatient/ED visit.  Follow Up Plan: Additional outreach attempts will be made to reach the patient to complete the Transitions of Care (Post Inpatient/ED visit) call.   Signature Karena Addison, LPN [facility] Nurse Health Advisor Direct Dial [phone]

## 2023-12-14 NOTE — Transitions of Care (Post Inpatient/ED Visit) (Signed)
   12/14/2023  Name: Keith Larsen MRN: [id number] DOB: [birth year]  Today's TOC FU Call Status: Today's TOC FU Call Status:: Successful TOC FU Call Completed Unsuccessful Call (1st Attempt) Date: 12/13/23 [facility] FU Call Complete Date: 12/14/23 Patient's Name and Date of Birth confirmed.  Transition Care Management Follow-up Telephone Call Date of Discharge: 12/12/23 Discharge Facility: [facility]) Type of Discharge: Inpatient Admission Primary Inpatient Discharge Diagnosis:: ananphylactic shock How have you been since you were released from the hospital?: Better Any questions or concerns?: No  Items Reviewed: Did you receive and understand the discharge instructions provided?: Yes Medications obtained,verified, and reconciled?: Yes (Medications Reviewed) Any new allergies since your discharge?: No Dietary orders reviewed?: NA Do you have support at home?: Yes People in Home: spouse  Medications Reviewed Today: Medications Reviewed Today     Reviewed by Karena Addison, LPN (Licensed Practical Nurse) on 12/14/23 at 1418  Med List Status: <None>   Medication Order Taking? Sig Documenting Provider Last Dose Status Informant  loratadine (CLARITIN) 10 MG tablet [id number]  Take 1 tablet (10 mg total) by mouth daily. Tiffany Kocher, DO  Active   metFORMIN (GLUCOPHAGE-XR) 500 MG 24 hr tablet [id number] No Take 2 tablets (1,000 mg total) by mouth daily with breakfast. Elberta Fortis, MD 12/10/2023 Morning Active   Multiple Vitamins-Minerals (MULTIVITAMIN WITH MINERALS) tablet [id number] No Take 1 tablet by mouth daily. Prelox [provider] 12/10/2023 Morning Active Self, Spouse/Significant Other  rosuvastatin (CRESTOR) 10 MG tablet [id number] No Take 10 mg by mouth daily. [provider] 12/10/2023 Morning Active Self, Spouse/Significant Other            Home Care and Equipment/Supplies: Were Home Health Services Ordered?: NA Any new equipment or medical  supplies ordered?: NA  Functional Questionnaire: Do you need assistance with bathing/showering or dressing?: No Do you need assistance with meal preparation?: No Do you need assistance with eating?: No Do you have difficulty maintaining continence: No Do you need assistance with getting out of bed/getting out of a chair/moving?: No Do you have difficulty managing or taking your medications?: No  Follow up appointments reviewed: PCP Follow-up appointment confirmed?: Yes Date of PCP follow-up appointment?: 12/17/23 [facility] Follow-up appointment confirmed?: NA Do you need transportation to your follow-up appointment?: No Do you understand care options if your condition(s) worsen?: Yes-patient verbalized understanding    SIGNATURE Karena Addison, LPN [facility] Nurse Health Advisor Direct Dial [phone]

## 2023-12-15 ENCOUNTER — Inpatient Hospital Stay: Payer: Self-pay | Admitting: Family Medicine

## 2023-12-16 LAB — CULTURE, BLOOD (ROUTINE X 2)
Culture: NO GROWTH
Culture: NO GROWTH
Special Requests: ADEQUATE
Special Requests: ADEQUATE

## 2023-12-16 NOTE — Progress Notes (Signed)
 Reviewed and agree with Dr Macky Lower plan.

## 2023-12-17 ENCOUNTER — Other Ambulatory Visit (HOSPITAL_COMMUNITY): Payer: Self-pay

## 2023-12-17 ENCOUNTER — Ambulatory Visit: Admitting: Pulmonary Disease

## 2023-12-17 ENCOUNTER — Ambulatory Visit: Admitting: Student

## 2023-12-17 ENCOUNTER — Encounter: Payer: Self-pay | Admitting: Pulmonary Disease

## 2023-12-17 ENCOUNTER — Encounter: Payer: Self-pay | Admitting: Student

## 2023-12-17 VITALS — BP 133/84 | HR 95 | Ht 72.0 in | Wt 247.4 lb

## 2023-12-17 VITALS — BP 134/86 | HR 93 | Temp 97.6°F | Ht 72.0 in | Wt 249.0 lb

## 2023-12-17 DIAGNOSIS — T782XXD Anaphylactic shock, unspecified, subsequent encounter: Secondary | ICD-10-CM | POA: Diagnosis not present

## 2023-12-17 DIAGNOSIS — J189 Pneumonia, unspecified organism: Secondary | ICD-10-CM | POA: Diagnosis not present

## 2023-12-17 DIAGNOSIS — D649 Anemia, unspecified: Secondary | ICD-10-CM

## 2023-12-17 DIAGNOSIS — Z91041 Radiographic dye allergy status: Secondary | ICD-10-CM | POA: Diagnosis not present

## 2023-12-17 DIAGNOSIS — R590 Localized enlarged lymph nodes: Secondary | ICD-10-CM

## 2023-12-17 DIAGNOSIS — E119 Type 2 diabetes mellitus without complications: Secondary | ICD-10-CM

## 2023-12-17 DIAGNOSIS — R042 Hemoptysis: Secondary | ICD-10-CM | POA: Diagnosis not present

## 2023-12-17 DIAGNOSIS — N179 Acute kidney failure, unspecified: Secondary | ICD-10-CM | POA: Diagnosis not present

## 2023-12-17 MED ORDER — METFORMIN HCL 1000 MG PO TABS
1000.0000 mg | ORAL_TABLET | Freq: Two times a day (BID) | ORAL | 4 refills | Status: DC
  Filled 2023-12-17: qty 60, 30d supply, fill #0
  Filled 2024-01-17: qty 60, 30d supply, fill #1
  Filled 2024-02-19: qty 60, 30d supply, fill #2
  Filled 2024-03-21: qty 60, 30d supply, fill #3
  Filled 2024-04-21: qty 60, 30d supply, fill #4

## 2023-12-17 NOTE — Patient Instructions (Signed)
 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 We discussed your recent CT scan, treatment progress, and future care plans.  YOUR PLAN:  -PNEUMONIA: Pneumonia is an infection that inflames the air sacs in one or both lungs. Your recent CT scan suggested a patch of pneumonia, which was treated with antibiotics, leading to an improvement in your symptoms. We are awaiting the radiologist's report for further confirmation. If necessary, we may consider a repeat CT scan without contrast due to your allergy.  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 Your condition is long-standing and inactive, with no current concern for malignancy. We will continue to monitor your lymph nodes for any changes and avoid using contrast dye in future imaging due to your allergy.  -RETENTION CYST IN SINUS: A retention cyst in the sinus is a fluid-filled sac that can form due to prior sinus infections or allergies. It is not expected to cause significant problems and will be monitored for any changes or symptoms.  -CONTRAST DYE ALLERGY: You have an allergy to contrast dye, which is used in some imaging tests. This has been documented in your medical record to prevent future exposure. All future imaging will be conducted without contrast dye.  INSTRUCTIONS:  Please schedule a follow-up appointment in 3 months. We will contact you with the radiologist's report on your recent CT scan. If you experience any new or worsening symptoms, please contact our office immediately.

## 2023-12-17 NOTE — Assessment & Plan Note (Signed)
 Longstanding, currently uncontrolled. Increase metformin to 1000 mg twice daily Follow-up in 3 months for next A1c

## 2023-12-17 NOTE — Progress Notes (Signed)
    SUBJECTIVE:   CHIEF COMPLAINT / HPI:   Keith Larsen is a 63 year-old male here for hospital follow-up.  Admitted 12/11/2023-12/12/2023 for syncope, thought to be secondary to pseudo-anaphylaxis (not immunologic anaphylaxis) related to a drug reaction from iodinated contrast media. Cardiac workup was negative.  Today, reports feeling well. A little residual skin peeling, but getting better.  Had office visit this AM with pulmonology, plan to repeat CT scan in 3 months without contrast if needed.  He has started walking again and says his blood sugars are a lot better. He thinks they are higher in the winter and they are much more elevated. Taking Metformin 1,000 mg BID. Glucose readings at home have been 200s  PERTINENT  PMH / PSH:  Pulmonary hypertension Cirrhosis of liver T2DM  OBJECTIVE:   BP 133/84   Pulse 95   Ht 6' (1.829 m)   Wt 247 lb 6 oz (112.2 kg)   SpO2 96%   BMI 33.55 kg/m   General: NAD, well appearing Cardiac: RRR Neuro: A&O Respiratory: normal WOB on RA. No wheezing or crackles on auscultation, good lung sounds throughout Skin: Mild skin peeling of inner forearms, no ulceration or draining of skin  ASSESSMENT/PLAN:   Type 2 diabetes mellitus (HCC) Longstanding, currently uncontrolled. Increase metformin to 1000 mg twice daily Follow-up in 3 months for next A1c  Anaphylactic syndrome Today's visit was primarily for hospital follow-up from pseudo anaphylaxis from iodinated contrast. No patient concerns identified today. He has been returning to his activities of daily living without issue. He had follow-up with pulmonology I ordered a CBC and BMP today (he had a mild AKI on discharge, want to ensure this is resolved)     Darral Dash, DO [facility]

## 2023-12-17 NOTE — Assessment & Plan Note (Addendum)
 Today's visit was primarily for hospital follow-up from pseudo anaphylaxis from iodinated contrast. No patient concerns identified today. He has been returning to his activities of daily living without issue. He had follow-up with pulmonology I ordered a CBC and BMP today (he had a mild AKI on discharge, want to ensure this is resolved)

## 2023-12-17 NOTE — Patient Instructions (Signed)
 It was great seeing you today.  As we discussed, - Bloodwork today, will call if abnormal - Follow up in 3 months for diabetes check - I refilled your Metformin   If you have any questions or concerns, please feel free to call the clinic.   Have a wonderful day,  Dr. Darral Dash [facility] [phone]

## 2023-12-17 NOTE — Progress Notes (Signed)
 Subjective:    Patient ID: Keith Larsen, male    DOB: February 27, 1961, 63 y.o.   MRN: [id number]  Patient Care Team: Elberta Fortis, MD as PCP - General (Family Medicine) Salena Saner, MD as Consulting Physician (Pulmonary Disease)  Chief Complaint  Patient presents with   Follow-up    Occasional cough. No shortness of breath or wheezing.     BACKGROUND/INTERVAL:Patient is a 63 year old lifelong never smoker who presents for follow-up on chronic mediastinal adenopathy.  And recent bout of minor hemoptysis in the setting of pneumonia.  He was initially seen here on 03 December 2023.  HPI Discussed the use of AI scribe software for clinical note transcription with the patient, who gave verbal consent to proceed.  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  He has chronic mediastinal adenopathy likely associated with sarcoidosis, characterized by calcification of the lymph nodes, indicating a long-standing, non-active disease process. There is no current evidence of malignancy.  He has been totally asymptomatic in this regard  He has a history of hemoptysis, previously evaluated with a CT scan. A recent CT scan suggested a patch of pneumonia, which was treated with antibiotics, leading to an improvement in symptoms. He feels better since the treatment. No current issues with breathing and denies any wheezing.  He experienced an allergic reaction to contrast dye used during the CT scan, which he had also experienced during a previous scan in February when he had pneumonia. This reaction has been noted in his medical records to prevent future use of contrast dye.  He has been informed of a retention cyst in his left sinus, which is likely related to prior sinus infections or allergies. He has not reported any symptoms related to this cyst.     Review of Systems A 10  point review of systems was performed and it is as noted above otherwise negative.   Patient Active Problem List   Diagnosis Date Noted   Prolonged QT interval 12/11/2023   Chronic health problem 12/11/2023   Syncope 12/11/2023   Anaphylactic syndrome 12/11/2023   Tinea versicolor 11/29/2023   Pulmonary hypertension, primary (HCC) 10/22/2023   Cirrhosis of liver (HCC) 10/22/2023   Hyperthyroidism 10/21/2023   Incarcerated umbilical hernia 12/12/2017   Type 2 diabetes mellitus (HCC) 08/05/2014    Social History   Tobacco Use   Smoking status: Never   Smokeless tobacco: Never   Tobacco comments:    Occasional Cigar.   Substance Use Topics   Alcohol use: Yes    Comment: occasionally    Allergies  Allergen Reactions   Iodinated Contrast Media Other (See Comments)    Flexural surface pruritic rash, dizziness and syncope Needs premedication prior to any contrast media   Azithromycin Other (See Comments)    Possible pseudo-anaphylaxis, occurred at the same time as iodine contrast allergy.  Caution against use of macrolides when alternatives are available, as reaction was severe.    Current Meds  Medication Sig   loratadine (CLARITIN) 10 MG tablet Take 1 tablet (10 mg total) by mouth daily.   metFORMIN (GLUCOPHAGE-XR) 500 MG 24 hr tablet Take 2 tablets (1,000 mg total) by mouth daily with breakfast.   Multiple Vitamins-Minerals (MULTIVITAMIN WITH MINERALS) tablet Take 1 tablet by mouth daily. Prelox   rosuvastatin (CRESTOR) 10 MG tablet Take 10 mg by mouth daily.     There is  no immunization history on file for this patient.      Objective:   BP 134/86 (BP Location: Left Arm, Patient Position: Sitting, Cuff Size: Normal)   Pulse 93   Temp 97.6 F (36.4 C) (Temporal)   Ht 6' (1.829 m)   Wt 249 lb (112.9 kg)   SpO2 95%   BMI 33.77 kg/m   SpO2: 95 %  GENERAL: Obese, well-developed gentleman, no acute distress. HEAD: Normocephalic, atraumatic.  EYES: Pupils  equal, round, reactive to light.  No scleral icterus.  NOSE: Significant turbinate edema, obliteration nasal passages. MOUTH: Some missing teeth, poor dentition overall, oral mucosa moist.  No blood noted in the oral cavity. NECK: Supple. No thyromegaly. Trachea midline. No JVD.  No adenopathy. PULMONARY: Good air entry bilaterally.  No adventitious sounds. CARDIOVASCULAR: S1 and S2. Regular rate and rhythm.  No rubs, murmurs or gallops heard. ABDOMEN: Benign. MUSCULOSKELETAL: No joint deformity, no clubbing, no edema.  NEUROLOGIC: No overt focal deficit, no gait disturbance, speech is fluent.   SKIN: Intact,warm,dry. PSYCH: Mood and behavior normal.      Assessment & Plan:     ICD-10-CM   1. Hemoptysis - RESOLVED  R04.2     2. Mediastinal adenopathy - SARCOIDOSIS  R59.0     3. Community acquired pneumonia of right lung, unspecified part of lung  J18.9    Clinically resolved    4. Allergy to diagnostic dye  Z91.041      Discussion:    Pneumonia/minor hemoptysis Recent CT scan suggests a patch of pneumonia correlating with prior hemoptysis. Antibiotic treatment has led to resolution of hemoptysis. Awaiting radiologist's report for further confirmation. Future scans will be conducted without contrast due to allergy. 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 Lymph node calcification is typical of sarcoidosis, indicating a long-standing, inactive process with no malignancy concern. Calcification pattern is consistent with sarcoidosis. - Monitor lymphadenopathy - Avoid contrast dye in future imaging due to allergy  Retention cyst in sinus A retention cyst in the left sinus, likely secondary to prior sinus infections or allergies, is not expected to cause significant problems and is typically observed rather than  treated. - Monitor retention cyst for any changes or symptoms  Contrast dye allergy An adverse reaction to contrast dye during CT imaging confirmed a known allergy. This has been documented to prevent future exposure. Future imaging will be conducted without contrast. - Document contrast dye allergy in medical record - Advise avoidance of contrast dye in future procedures  Follow-up Follow-up is necessary to ensure resolution of pneumonia and to monitor chronic conditions. - Schedule follow-up appointment in 3 months - Contact with radiologist's report on CT scan      Advised if symptoms do not improve or worsen, to please contact office for sooner follow up or seek emergency care.    I spent 30 minutes of dedicated to the care of this patient on the date of this encounter to include pre-visit review of records, face-to-face time with the patient discussing conditions above, post visit ordering of testing, clinical documentation with the electronic health record, making appropriate referrals as documented, and communicating necessary findings to members of the patients care team.     C. Danice Goltz, MD Advanced Bronchoscopy PCCM [place] [facility]-    *This note was generated using voice recognition software/Dragon and/or AI transcription program.  Despite  best efforts to proofread, errors can occur which can change the meaning. Any transcriptional errors that result from this process are unintentional and may not be fully corrected at the time of dictation.

## 2023-12-18 ENCOUNTER — Other Ambulatory Visit (HOSPITAL_COMMUNITY): Payer: Self-pay

## 2023-12-18 LAB — BASIC METABOLIC PANEL WITH GFR
BUN/Creatinine Ratio: 14 (ref 10–24)
BUN: 17 mg/dL (ref 8–27)
CO2: 22 mmol/L (ref 20–29)
Calcium: 9.9 mg/dL (ref 8.6–10.2)
Chloride: 98 mmol/L (ref 96–106)
Creatinine, Ser: 1.19 mg/dL (ref 0.76–1.27)
Glucose: 225 mg/dL — ABNORMAL HIGH (ref 70–99)
Potassium: 4.4 mmol/L (ref 3.5–5.2)
Sodium: 136 mmol/L (ref 134–144)
eGFR: 69 mL/min/{1.73_m2} (ref >59)

## 2023-12-18 LAB — CBC
Hematocrit: 40.2 % (ref 37.5–51.0)
Hemoglobin: 13.5 g/dL (ref 13.0–17.7)
MCH: 31.3 pg (ref 26.6–33.0)
MCHC: 33.6 g/dL (ref 31.5–35.7)
MCV: 93 fL (ref 79–97)
Platelets: 208 10*3/uL (ref 150–450)
RBC: 4.32 x10E6/uL (ref 4.14–5.80)
RDW: 12.3 % (ref 11.6–15.4)
WBC: 5.3 10*3/uL (ref 3.4–10.8)

## 2023-12-20 ENCOUNTER — Encounter: Payer: Self-pay | Admitting: Student

## 2024-01-07 ENCOUNTER — Ambulatory Visit: Admitting: Pharmacist

## 2024-01-07 ENCOUNTER — Encounter: Payer: Self-pay | Admitting: Pharmacist

## 2024-01-07 VITALS — BP 138/83 | HR 77 | Wt 254.0 lb

## 2024-01-07 DIAGNOSIS — E119 Type 2 diabetes mellitus without complications: Secondary | ICD-10-CM | POA: Diagnosis not present

## 2024-01-07 NOTE — Progress Notes (Signed)
    S:     Chief Complaint  Patient presents with   Medication Management   63 y.o. male who presents for diabetes evaluation, education, and management. Patient arrives in good spirits and presents without any assistance.  Patient was referred and last seen by Primary Care Provider, Dr. Thomas Fleischer, on 12/17/2023.   PMH is significant for T2DM, liver cirrhosis, anaphylactic syndrome.  Patient recently hospitalized for anaphylactic reaction to contrast dye at end of March 2025.  Patient reports Diabetes was diagnosed in 2015.   Current diabetes medications include: metformin  1,000 mg BID Current hyperlipidemia medications include: rosuvastatin  10 mg  Patient reports adherence to taking all medications as prescribed.   Do you feel that your medications are working for you? yes Have you been experiencing any side effects to the medications prescribed? no Do you have any problems obtaining medications due to transportation or finances? no Insurance coverage: [facility]  Patient reports hypoglycemic events.  Reported home fasting blood sugars: ~160s  Reported 2 hour post-meal/random blood sugars: ~160s.  Patient-reported exercise habits: restarting activity after wintertime   O:   Review of Systems  All other systems reviewed and are negative.   Physical Exam Pulmonary:     Effort: Pulmonary effort is normal.  Neurological:     Mental Status: He is alert.  Psychiatric:        Mood and Affect: Mood normal.        Behavior: Behavior normal.        Thought Content: Thought content normal.    Lab Results  Component Value Date   HGBA1C 10.9 (A) 12/10/2023   Vitals:   01/07/24 1015  BP: 138/83  Pulse: 77  SpO2: 98%    Lipid Panel     Component Value Date/Time   CHOL 108 10/21/2023 1604   TRIG 117 10/21/2023 1604   HDL 36 (L) 10/21/2023 1604   CHOLHDL 3.0 10/21/2023 1604   VLDL 23 10/21/2023 1604   LDLCALC 49 10/21/2023 1604   LDLDIRECT 145.1 01/14/2007 0953     Clinical Atherosclerotic Cardiovascular Disease (ASCVD): No  The ASCVD Risk score (Arnett DK, et al., 2019) failed to calculate for the following reasons:   The valid total cholesterol range is 130 to 320 mg/dL   A/P: Diabetes longstanding currently uncontrolled with A1c of 10.9%. Patient is able to verbalize appropriate hypoglycemia management plan. Medication adherence appears to be good. Control is suboptimal due to lack of activity and diet. -Patient has decided to withdraw from Liberate study at this time, prefers to use home glucose monitor instead. -Continued metformin  1,000 mg BID. -Patient will continue to make lifestyle modifications such as increasing exercise and diet.  -Extensively discussed pathophysiology of diabetes, recommended lifestyle interventions, dietary effects on blood sugar control.  -Counseled on s/sx of and management of hypoglycemia.  -Next A1c anticipated May 2025.   ASCVD risk - primary prevention in patient with diabetes. Last LDL is 23; at goal of <70 mg/dL. ASCVD risk factors include T2DM and HTN. moderate intensity statin indicated.  -Continued rosuvastatin  10 mg.   Written patient instructions provided. Patient verbalized understanding of treatment plan.  Total time in face to face counseling 26 minutes.    Follow-up:  Pharmacist as needed PCP clinic visit in June 2025 Patient seen with Jeanine Millers, PharmD Candidate.

## 2024-01-07 NOTE — Progress Notes (Signed)
 Reviewed and agree with Dr Macky Lower plan.

## 2024-01-07 NOTE — Patient Instructions (Signed)
It was nice to see you today!  Your goal blood sugar is 80-130 before eating and less than 180 after eating.  Medication Changes: Continue all medications the same.   Monitor blood sugars at home and keep a log (glucometer or piece of paper) to bring with you to your next visit.  Keep up the good work with diet and exercise. Aim for a diet full of vegetables, fruit and lean meats (chicken, [place], fish). Try to limit salt intake by eating fresh or frozen vegetables (instead of canned), rinse canned vegetables prior to cooking and do not add any additional salt to meals.

## 2024-01-07 NOTE — Assessment & Plan Note (Signed)
 Diabetes longstanding currently uncontrolled with A1c of 10.9%. Patient is able to verbalize appropriate hypoglycemia management plan. Medication adherence appears to be good. Control is suboptimal due to lack of activity and diet. -Patient has decided to withdraw from Liberate study at this time, prefers to use home glucose monitor instead. -Continued metformin  1,000 mg BID. -Patient will continue to make lifestyle modifications such as increasing exercise and diet.  -Extensively discussed pathophysiology of diabetes, recommended lifestyle interventions, dietary effects on blood sugar control.  -Counseled on s/sx of and management of hypoglycemia.

## 2024-01-17 ENCOUNTER — Other Ambulatory Visit (HOSPITAL_COMMUNITY): Payer: Self-pay

## 2024-01-26 ENCOUNTER — Telehealth (INDEPENDENT_AMBULATORY_CARE_PROVIDER_SITE_OTHER): Payer: Self-pay

## 2024-01-26 NOTE — Telephone Encounter (Signed)
 Attempted contact pt to confirm correct address, pt would prefer text sent to home phone number on file/NM

## 2024-01-28 ENCOUNTER — Ambulatory Visit (INDEPENDENT_AMBULATORY_CARE_PROVIDER_SITE_OTHER): Payer: Self-pay | Admitting: Otolaryngology

## 2024-01-28 ENCOUNTER — Encounter (INDEPENDENT_AMBULATORY_CARE_PROVIDER_SITE_OTHER): Payer: Self-pay | Admitting: Otolaryngology

## 2024-01-28 VITALS — BP 163/87 | HR 75 | Ht 72.0 in | Wt 249.0 lb

## 2024-01-28 DIAGNOSIS — K1321 Leukoplakia of oral mucosa, including tongue: Secondary | ICD-10-CM

## 2024-01-28 DIAGNOSIS — D3702 Neoplasm of uncertain behavior of tongue: Secondary | ICD-10-CM

## 2024-01-28 DIAGNOSIS — K1379 Other lesions of oral mucosa: Secondary | ICD-10-CM

## 2024-01-28 NOTE — Progress Notes (Signed)
 ENT CONSULT:  Reason for Consult: right bottom tongue lesion    HPI: Discussed the use of AI scribe software for clinical note transcription with the patient, who gave verbal consent to proceed.  History of Present Illness Keith Larsen is a 63 year old male, former cigar smoker,  who presents with a white lesion on the bottom of his tongue (right side) present for several years.   He has had a white lesion on the bottom of his right tongue for several years. The lesion feels like 'some food back there' and is not smooth compared to the rest of his tongue. There is no pain, numbness, or issues with tongue movement, but he mentions a sensation of swelling. The lesion has varied in size over time, sometimes appearing smaller and at other times showing more white.  He has a history of smoking cigarettes in the past, but not on a daily basis, and denies current use of tobacco products. He reports a history of social alcohol use, acknowledging that he might have consumed excessively at one point, but denies current heavy alcohol use. No history of immunosuppression, strokes, heart attacks, or use of blood thinners. He mentions experiencing stress over the past couple of years.  No pain, numbness, or issues with tongue movement. No pain with swallowing, deeper throat pain, and no history of asthma or use of inhalers.  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 He also has contrast allergy (reacted to contrast during past CT scan)  Records Reviewed:  Office Visit Pulm 12/17/23 Dr Viva Grise Pneumonia/minor hemoptysis Recent CT scan suggests a patch of pneumonia correlating with prior hemoptysis. Antibiotic treatment has led to resolution of hemoptysis. Awaiting radiologist's report for further confirmation. Future scans will be conducted without contrast due to allergy. 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 Lymph node calcification is typical of sarcoidosis, indicating a long-standing, inactive process with no malignancy concern. Calcification pattern is consistent with sarcoidosis. 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 - Monitor retention cyst for any changes or symptoms   Contrast dye allergy An adverse reaction to contrast dye during CT imaging confirmed a known allergy. This has been documented to prevent future exposure. Future imaging will be conducted without contrast. - Document contrast dye allergy in medical record - Advise avoidance of contrast dye in future procedures     Past Medical History:  Diagnosis Date   Diabetes mellitus without complication [facility])     Past Surgical History:  Procedure Laterality Date   PATELLAR TENDON REPAIR Left 02/20/2020   Procedure: PATELLA TENDON REPAIR;  Surgeon: Elner Hahn, MD;  Location: [facility] ORS;  Service: Orthopedics;  Laterality: Left;   UMBILICAL HERNIA REPAIR N/A 12/12/2017   Procedure: LAPAROSCOPIC UMBILICAL HERNIA  Repair;  Surgeon: Juanita Norlander, MD;  Location: [facility] ORS;  Service: General;  Laterality: N/A;    History reviewed. No pertinent family history.  Social History:  reports that he has never smoked. He has never used smokeless tobacco. He reports current alcohol use. He reports that he does not use drugs.  Allergies:  Allergies  Allergen Reactions   Azithromycin  Other (See Comments)    Possible pseudo-anaphylaxis, occurred at the same time as iodine contrast allergy.  Caution against use of macrolides when alternatives are available, as  reaction was severe.   Iodinated Contrast Media Anaphylaxis    Flexural surface pruritic rash, dizziness and syncope - reason for admission 12/11/2023    Medications: I have reviewed the patient's current medications.  The PMH, PSH, Medications, Allergies, and SH were reviewed and updated.  ROS: Constitutional: Negative for fever, weight loss and weight gain. Cardiovascular: Negative for chest pain and dyspnea on exertion. Respiratory: Is not experiencing shortness of breath at rest. Gastrointestinal: Negative for nausea and vomiting. Neurological: Negative for headaches. Psychiatric: The patient is not nervous/anxious  Blood pressure (!) 163/87, pulse 75, height 6' (1.829 m), weight 249 lb (112.9 kg), SpO2 90%. Body mass index is 33.77 kg/m.  PHYSICAL EXAM:  Exam: General: Well-developed, well-nourished Respiratory Respiratory effort: Equal inspiration and expiration without stridor Cardiovascular Peripheral Vascular: Warm extremities with equal color/perfusion Eyes: No nystagmus with equal extraocular motion bilaterally Neuro/Psych/Balance: Patient oriented to person, place, and time; Appropriate mood and affect; Gait is intact with no imbalance; Cranial nerves I-XII are intact Head and Face Inspection: Normocephalic and atraumatic without mass or lesion Palpation: Facial skeleton intact without bony stepoffs Salivary Glands: No mass or tenderness Facial Strength: Facial motility symmetric and full bilaterally ENT Pinna: External ear intact and fully developed External canal: Canal is patent with intact skin Tympanic Membrane: Clear and mobile External Nose: No scar or anatomic deformity Internal Nose: Septum is with caudal spur on the right. Lips, Teeth, and gums: Mucosa and teeth intact and viable Oral cavity/oropharynx: Right ventral tongue with 3 cm leukoplakia and rough surface no other lesions, no numbness no tongue weakness, no friable mucosa, no bleeding   Neck Neck and Trachea: Midline trachea without mass or lesion Thyroid: No mass or nodularity Lymphatics: No lymphadenopathy  Procedure: Scope deferred today will consider in the future   Studies Reviewed: CT chest w/contrast 12/10/23 CLINICAL DATA:  Mediastinal adenopathy seen on prior imaging Followup  Cardiovascular: No significant vascular findings. Normal heart size. No pericardial effusion.   Mediastinum/Nodes: Comparison with prior examination accounting for differences in techniques there is similar adenopathy along the pretracheal retrocaval space, aortic pulmonic window, precarinal infracarinal and precarinal regions. With very small bilateral hilar adenopathy. Overall no significant change in the size and appearance of the adenopathy. Some of the lymph nodes are calcified.   Lungs/Pleura: Comparison with prior examinations demonstrates some residual patchy infiltrates along the peripheral portion of the right upper lobe anterior segment, and some patchy partially consolidating infiltrates along the most posterior margin of the right upper lobe posterior segment. There is also hypoventilatory right middle lobe atelectasis minimal left lower lobe and right lower lobe hypoventilatory atelectasis without consolidations.   No significant change in the small subpleural bulla changes.   Findings are consistent with improving but incompletely resolved pneumonic infiltrates.   Upper Abdomen: No acute abnormality.   Musculoskeletal: No chest wall abnormality. No acute or significant osseous findings.   IMPRESSION: Improving but incompletely resolved pneumonic infiltrates  Assessment/Plan: Encounter Diagnoses  Name Primary?   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 He has hx of pulmonary sarcoidosis. No  other lesions noted on exam. Denies hx of smoking, and only social ETOH use, no inhaler use. Biopsy needed for diagnosis. - Schedule biopsy under local anesthesia.    He has multiple co morbidities including liver cirrhosis, pulmonary hypertension. He also has hx of PNA and brief episode of hemoptysis based on record review from Pulm visit - will consider flexible laryngoscopy when he returns.   Thank you for allowing me to participate in the care of this patient. Please do not hesitate to contact me with any questions or concerns.   Artice Last, MD Otolaryngology [facility] Phone: [phone] Fax: [phone]    01/29/2024, 6:18 AM

## 2024-02-18 ENCOUNTER — Ambulatory Visit: Admitting: Student

## 2024-02-18 ENCOUNTER — Other Ambulatory Visit (INDEPENDENT_AMBULATORY_CARE_PROVIDER_SITE_OTHER): Payer: Self-pay | Admitting: Otolaryngology

## 2024-02-18 ENCOUNTER — Encounter (INDEPENDENT_AMBULATORY_CARE_PROVIDER_SITE_OTHER): Payer: Self-pay | Admitting: Otolaryngology

## 2024-02-18 ENCOUNTER — Ambulatory Visit (INDEPENDENT_AMBULATORY_CARE_PROVIDER_SITE_OTHER): Admitting: Otolaryngology

## 2024-02-18 VITALS — BP 138/88 | HR 74 | Ht 72.0 in | Wt 258.0 lb

## 2024-02-18 VITALS — BP 162/93 | HR 63

## 2024-02-18 DIAGNOSIS — D3702 Neoplasm of uncertain behavior of tongue: Secondary | ICD-10-CM | POA: Diagnosis not present

## 2024-02-18 DIAGNOSIS — H1131 Conjunctival hemorrhage, right eye: Secondary | ICD-10-CM | POA: Diagnosis not present

## 2024-02-18 DIAGNOSIS — H11153 Pinguecula, bilateral: Secondary | ICD-10-CM

## 2024-02-18 DIAGNOSIS — K1321 Leukoplakia of oral mucosa, including tongue: Secondary | ICD-10-CM | POA: Diagnosis not present

## 2024-02-18 MED ORDER — CHLORHEXIDINE GLUCONATE 0.12 % MT SOLN
15.0000 mL | Freq: Two times a day (BID) | OROMUCOSAL | 0 refills | Status: AC
Start: 2024-02-18 — End: 2024-02-25

## 2024-02-18 NOTE — Assessment & Plan Note (Signed)
 Bilateral yellowish plaques on conjunctiva consistent with pinguecula which is nonconcerning.  No further workup needed.

## 2024-02-18 NOTE — Assessment & Plan Note (Addendum)
 Subconjunctival hemorrhage in the right eye likely due to straining.  Low concern for serious pathology such as acute angle-closure glaucoma given no pain or vision changes.  Low concern for conjunctivitis given no drainage, lid edema, pain or itchiness.  - Monitor for changes such as changes in vision or pain and seek immediate care if these occur.  - Follow up with an ophthalmologist if symptoms worsen or do not resolve.  -Expect this will be self resolving

## 2024-02-18 NOTE — Progress Notes (Signed)
    SUBJECTIVE:   CHIEF COMPLAINT / HPI:   The patient presents with redness in the right eye. Redness in the right eye began on Wednesday as a small spot and expanded by Thursday, remaining stable since. There is no pain, vision changes, or itchiness. He regularly wears contact lenses and removed them yesterday, but the redness persisted. He is currently wearing his contacts. He recalls significant straining during a bowel movement on Wednesday prior to noticing the right eye. No recent eye injuries or known eye conditions. He regularly visits an eye doctor.   PERTINENT  PMH / PSH: T2DM, HTN  OBJECTIVE:   BP 138/88   Pulse 74   Ht 6' (1.829 m)   Wt 258 lb (117 kg)   SpO2 98%   BMI 34.99 kg/m    General: NAD, pleasant, able to participate in exam HEENT: Diffusely erythematous conjunctive of right eye sparing the corneal limbus.  No drainage or lid edema.  PERRL, EOEMI with out pain, yellowish plaque on bilateral conjunctiva Cardiac: Well-perfused Respiratory: Normal effort Neuro: alert, no obvious focal deficits Psych: Normal affect and mood   ASSESSMENT/PLAN:   Subconjunctival hemorrhage of right eye Subconjunctival hemorrhage in the right eye likely due to straining.  Low concern for serious pathology such as acute angle-closure glaucoma given no pain or vision changes.  Low concern for conjunctivitis given no drainage, lid edema, pain or itchiness.  - Monitor for changes such as changes in vision or pain and seek immediate care if these occur.  - Follow up with an ophthalmologist if symptoms worsen or do not resolve.  -Expect this will be self resolving  Pinguecula of both eyes Bilateral yellowish plaques on conjunctiva consistent with pinguecula which is nonconcerning.  No further workup needed.   Return for scheduled PCP visit on 02/25/2024  Dr. Glenn Lange, DO [facility]

## 2024-02-18 NOTE — Progress Notes (Signed)
 Procedure: Excision of oral tongue lesion without closure (13086)   ATTENDING: Artice Last, M.D.   PREOPERATIVE DIAGNOSIS(ES):  tongue leukoplakia, tongue lesion  POSTOPERATIVE DIAGNOSIS(ES): Same  PROCEDURE PERFORMED: biopsy of right ventral lateral tongue lesion leukoplakia  INDICATIONS FOR PROCEDURE: oral tongue lesion  The risks and benefits of the surgical procedure have been explained in detail to the patient and they have elected to proceed.  CONSENT:  Informed consent was obtained prior to the procedure after discussion of risks, benefits, and alternatives and expected outcomes were discussed with the patient; consent placed in chart. The possibilities of reaction to medication, bleeding, infection, inadequate biopsy, the need for additional procedures, failure to diagnose a condition, and creating a complication requiring transfusion or operation were discussed with the patient. The patient concurred with the proposed plan, giving informed consent.    UNIVERSAL PROTOCOL/ TIMEOUT: Preprocedure verification is complete- patient verified and consents confirmed.  H&P REVIEW: The patient's history and physical were reviewed today prior to procedure. All medications were reviewed and updated as well.  ANESTHESIA: local anesthesia with 1% lidocaine , 1:100,000 epinephrine   PROCEDURE DETAILS: The patient was brought to the clinic and placed in a seated position.  The mucosa over the lesion was anesthetized.  A small incision was made over the lesion and the lesion was dissected from surrounding structures and removed. The incision was made at the border of the lesion.  It was sent for permanent pathology. Hemostasis was obtained using Afrin soaked pledgets and Silver Nitrate. There were no complications and the patient tolerated the procedure well.  ESTIMATED BLOOD LOSS: Minimal  SPECIMEN(S) REMOVED: Right lateral ventral tongue lesion - submitted for permanent and in Michel's solution  for auto-immune staining   DISPOSITION OF SPECIMEN(S): Permanent pathology  FINDINGS:  No evidence of significant bleeding or other complication  CONDITION: Stable  COMPLICATIONS:The patient tolerated the procedure well without apparent complications and was ambulatory.  NOTES: given rx for abx and Peridex  mouthwash

## 2024-02-18 NOTE — Patient Instructions (Addendum)
 It was great to see you! Thank you for allowing me to participate in your care!   Our plans for today:  - Your symptoms are most consistent with t a subconjunctival hemorrhage which should resolve on its'; own. If you develop any pain or changes in vision and cannot get in to your eye doctor right away, return or go to the ED. - Schedule an appointment with your PCP next month  for diabetes check     Take care and seek immediate care sooner if you develop any concerns.   Dr. Glenn Lange, DO [facility]

## 2024-02-21 LAB — DERMATOLOGY PATHOLOGY

## 2024-02-22 ENCOUNTER — Telehealth (INDEPENDENT_AMBULATORY_CARE_PROVIDER_SITE_OTHER): Payer: Self-pay

## 2024-02-22 ENCOUNTER — Other Ambulatory Visit (INDEPENDENT_AMBULATORY_CARE_PROVIDER_SITE_OTHER): Payer: Self-pay | Admitting: Otolaryngology

## 2024-02-22 ENCOUNTER — Ambulatory Visit (INDEPENDENT_AMBULATORY_CARE_PROVIDER_SITE_OTHER): Payer: Self-pay

## 2024-02-22 DIAGNOSIS — B370 Candidal stomatitis: Secondary | ICD-10-CM

## 2024-02-22 MED ORDER — NYSTATIN 100000 UNIT/ML MT SUSP: 5.0000 mL | Freq: Four times a day (QID) | OROMUCOSAL | 0 refills | Status: AC

## 2024-02-22 NOTE — Progress Notes (Signed)
 Candidal hyphae on tongue biopsy, rx for Nystatin sent

## 2024-02-22 NOTE — Telephone Encounter (Signed)
 Spoke to patient about results and let him know to return for is f/u. Patient said that he understood.

## 2024-02-23 ENCOUNTER — Ambulatory Visit: Admitting: Student

## 2024-02-25 ENCOUNTER — Other Ambulatory Visit (HOSPITAL_COMMUNITY): Payer: Self-pay

## 2024-02-25 ENCOUNTER — Encounter: Payer: Self-pay | Admitting: Student

## 2024-02-25 ENCOUNTER — Ambulatory Visit: Admitting: Student

## 2024-02-25 VITALS — BP 136/74 | HR 77 | Ht 72.0 in | Wt 255.0 lb

## 2024-02-25 DIAGNOSIS — E119 Type 2 diabetes mellitus without complications: Secondary | ICD-10-CM | POA: Diagnosis not present

## 2024-02-25 LAB — POCT GLYCOSYLATED HEMOGLOBIN (HGB A1C): HbA1c, POC (controlled diabetic range): 8.9 % — AB (ref 0.0–7.0)

## 2024-02-25 MED ORDER — OZEMPIC (0.25 OR 0.5 MG/DOSE) 2 MG/3ML ~~LOC~~ SOPN
0.2500 mg | PEN_INJECTOR | SUBCUTANEOUS | 3 refills | Status: DC
  Filled 2024-02-25: qty 3, 28d supply, fill #0

## 2024-02-25 NOTE — Assessment & Plan Note (Addendum)
 Improved, still not at goal A1c of 7.0 or below. Continue Metformin  1000 mg BID Risk/benefit discussion of Jardiance vs. Ozempic ; patient prefers to initiate Ozempic which was sent to pharmacy especially given obesity (BMI 34%) If not covered, will prescribe Jardiance 10 mg daily F/u with PCP in 1 month of initiating new medication

## 2024-02-25 NOTE — Progress Notes (Signed)
    SUBJECTIVE:   CHIEF COMPLAINT / HPI:   T2DM A1c today 8.9%, down from 10.9% last visit. Taking Metformin  1000 mg BID Feeling better than he was before starting his Metformin  He walks a lot Regarding diet, his wife cooks a lot and they've been baking more food, staying away from fast food  PERTINENT  PMH / PSH: Liver cirrhosis, pulmonary hypertension  OBJECTIVE:   BP 136/74   Pulse 77   Ht 6' (1.829 m)   Wt 255 lb (115.7 kg)   SpO2 99%   BMI 34.58 kg/m   General: NAD, well appearing Cardiac: RRR Neuro: A&O Respiratory: normal WOB on RA. No wheezing or crackles on auscultation, good lung sounds throughout Extremities: Moving all 4 extremities equally . Normal sensation in both feet with no signs of ulceration, drainage  ASSESSMENT/PLAN:   Type 2 diabetes mellitus (HCC) Improved, still not at goal A1c of 7.0 or below. Continue Metformin  1000 mg BID Risk/benefit discussion of Jardiance vs. Ozempic ; patient prefers to initiate Ozempic which was sent to pharmacy especially given obesity (BMI 34%) If not covered, will prescribe Jardiance 10 mg daily F/u with PCP in 1 month of initiating new medication    Loann Chahal, DO [facility]

## 2024-02-25 NOTE — Patient Instructions (Signed)
 It was great seeing you today.  As we discussed, - Continue your Metformin  at current dose - I sent in Ozempic, will await insurance decision. If denied, I will send in Jardiance - See you back in 3 months   If you have any questions or concerns, please feel free to call the clinic.   Have a wonderful day,  Dr. Vallorie Gayer [facility] [phone]

## 2024-03-03 ENCOUNTER — Other Ambulatory Visit (HOSPITAL_COMMUNITY): Payer: Self-pay

## 2024-03-06 ENCOUNTER — Telehealth: Payer: Self-pay

## 2024-03-06 NOTE — Telephone Encounter (Signed)
 Pharmacy Patient Advocate Encounter   Received notification from CoverMyMeds that prior authorization for OZEMPIC  0.25/0.5MG  is required/requested.   Insurance verification completed.   The patient is insured through [employer] .   PA required; PA submitted to above mentioned insurance via CoverMyMeds Key/confirmation #/[employer]. Status is pending

## 2024-03-07 NOTE — Telephone Encounter (Signed)
 Pharmacy Patient Advocate Encounter  Received notification from [employer] that Prior Authorization for OZEMPIC  0.25/0.5MG  has been APPROVED from 03/06/24 to 03/07/25   PA #/Case ID/Reference #: [id number]

## 2024-03-08 ENCOUNTER — Other Ambulatory Visit (HOSPITAL_COMMUNITY): Payer: Self-pay

## 2024-03-10 ENCOUNTER — Ambulatory Visit (INDEPENDENT_AMBULATORY_CARE_PROVIDER_SITE_OTHER): Admitting: Otolaryngology

## 2024-03-18 ENCOUNTER — Other Ambulatory Visit: Payer: Self-pay | Admitting: Family Medicine

## 2024-03-21 ENCOUNTER — Other Ambulatory Visit (HOSPITAL_COMMUNITY): Payer: Self-pay

## 2024-03-24 ENCOUNTER — Ambulatory Visit: Admitting: Pulmonary Disease

## 2024-04-21 ENCOUNTER — Encounter (INDEPENDENT_AMBULATORY_CARE_PROVIDER_SITE_OTHER): Payer: Self-pay

## 2024-04-21 ENCOUNTER — Ambulatory Visit (INDEPENDENT_AMBULATORY_CARE_PROVIDER_SITE_OTHER): Admitting: Otolaryngology

## 2024-05-23 ENCOUNTER — Other Ambulatory Visit: Payer: Self-pay

## 2024-05-24 ENCOUNTER — Other Ambulatory Visit (HOSPITAL_COMMUNITY): Payer: Self-pay

## 2024-05-25 ENCOUNTER — Other Ambulatory Visit (HOSPITAL_COMMUNITY): Payer: Self-pay

## 2024-05-25 ENCOUNTER — Other Ambulatory Visit: Payer: Self-pay | Admitting: Family Medicine

## 2024-05-25 MED ORDER — METFORMIN HCL 1000 MG PO TABS
1000.0000 mg | ORAL_TABLET | Freq: Two times a day (BID) | ORAL | 4 refills | Status: AC
  Filled 2024-05-25: qty 60, 30d supply, fill #0
  Filled 2024-06-25: qty 60, 30d supply, fill #1
  Filled 2024-07-25: qty 60, 30d supply, fill #2
  Filled 2024-08-23: qty 60, 30d supply, fill #3
  Filled 2024-09-22: qty 60, 30d supply, fill #4

## 2024-05-26 ENCOUNTER — Other Ambulatory Visit: Payer: Self-pay

## 2024-05-26 ENCOUNTER — Other Ambulatory Visit (HOSPITAL_COMMUNITY): Payer: Self-pay

## 2024-06-02 ENCOUNTER — Ambulatory Visit: Admitting: Pulmonary Disease

## 2024-06-08 ENCOUNTER — Telehealth: Payer: Self-pay | Admitting: Family Medicine

## 2024-06-08 NOTE — Telephone Encounter (Signed)
 Called patient to schedule Diabetes follow up. No answer VM not set up.

## 2024-06-26 ENCOUNTER — Other Ambulatory Visit (HOSPITAL_COMMUNITY): Payer: Self-pay

## 2024-06-27 ENCOUNTER — Other Ambulatory Visit (HOSPITAL_COMMUNITY): Payer: Self-pay

## 2024-07-21 ENCOUNTER — Encounter: Payer: Self-pay | Admitting: Family Medicine

## 2024-07-21 ENCOUNTER — Other Ambulatory Visit (HOSPITAL_COMMUNITY): Payer: Self-pay

## 2024-07-21 ENCOUNTER — Ambulatory Visit (INDEPENDENT_AMBULATORY_CARE_PROVIDER_SITE_OTHER): Admitting: Family Medicine

## 2024-07-21 ENCOUNTER — Other Ambulatory Visit: Payer: Self-pay

## 2024-07-21 VITALS — BP 150/80 | HR 77 | Ht 72.0 in | Wt 261.2 lb

## 2024-07-21 DIAGNOSIS — G8929 Other chronic pain: Secondary | ICD-10-CM

## 2024-07-21 DIAGNOSIS — I10 Essential (primary) hypertension: Secondary | ICD-10-CM

## 2024-07-21 DIAGNOSIS — M25571 Pain in right ankle and joints of right foot: Secondary | ICD-10-CM

## 2024-07-21 DIAGNOSIS — E119 Type 2 diabetes mellitus without complications: Secondary | ICD-10-CM

## 2024-07-21 LAB — POCT GLYCOSYLATED HEMOGLOBIN (HGB A1C): HbA1c, POC (controlled diabetic range): 7.6 % — AB (ref 0.0–7.0)

## 2024-07-21 MED ORDER — OZEMPIC (0.25 OR 0.5 MG/DOSE) 2 MG/3ML ~~LOC~~ SOPN
PEN_INJECTOR | SUBCUTANEOUS | 3 refills | Status: AC
  Filled 2024-07-21: qty 3, 56d supply, fill #0

## 2024-07-21 MED ORDER — AMLODIPINE-OLMESARTAN 5-20 MG PO TABS
1.0000 | ORAL_TABLET | Freq: Every day | ORAL | 2 refills | Status: AC
  Filled 2024-07-21: qty 30, 30d supply, fill #0

## 2024-07-21 MED ORDER — ROSUVASTATIN CALCIUM 10 MG PO TABS
10.0000 mg | ORAL_TABLET | Freq: Every day | ORAL | 2 refills | Status: AC
  Filled 2024-07-21: qty 90, 90d supply, fill #0

## 2024-07-21 NOTE — Assessment & Plan Note (Signed)
 A1c 7.6, improved from prior.  Doing well on metformin  but desires additional weight loss therefore will retrial GLP-1 therapy. - Start Ozempic  0.25 mg weekly for one month, then increase to 0.5 mg weekly - Follow-up A1c in 3 months -Recommended to restart statin, refilled rosuvastatin  10 mg daily

## 2024-07-21 NOTE — Progress Notes (Signed)
    SUBJECTIVE:   CHIEF COMPLAINT / HPI:   Discussed the use of AI scribe software for clinical note transcription with the patient, who gave verbal consent to proceed.  Right ankle pain - Pain localized over the right ankle bone for approximately one month - Initial pain was severe, now improved - No specific injury preceding onset; pain began while walking - No pain in the heel or plantar aspect of the foot - Uses a brace and cushioned shoes, which provide stability and alleviate heel soreness  Elevated blood pressure - Home blood pressure readings approximately 140s mmHg systolic, feels like they have been creeping up - Mild headache present - Not currently taking antihypertensive medication - Not currently taking statin due to running out of medication    Diabetes Current Regimen: Metformin  1000mg  BID, previously prescribed Ozempic  but was never able to obtain it from the pharmacy. CBGs: Not checking Last A1c:  Lab Results  Component Value Date   HGBA1C 7.6 (A) 07/21/2024    Denies polyuria, polydipsia, hypoglycemia Last Eye Exam: UTD Statin: Rosuvastan 10mg  daily -has not taken recently since running out of the medication ACE/ARB: None  PERTINENT  PMH / PSH: T2DM, HLD, cirrhosis, pulmonary hypertension  OBJECTIVE:   BP (!) 150/80   Pulse 77   Ht 6' (1.829 m)   Wt 261 lb 4 oz (118.5 kg)   SpO2 96%   BMI 35.43 kg/m    General: NAD, pleasant, able to participate in exam Cardiac: RRR, no murmurs. Respiratory: CTAB, normal effort, No wheezes, rales or rhonchi MSK: Right ankle/foot: -Mild edema around inferior portion of medial malleoli without surrounding erythema -Mild tenderness to palpation over medial malleoli -Full ROM of ankle without pain -Negative talar tilt and anterior drawer Extremities: no edema or cyanosis. Skin: warm and dry, no rashes noted Neuro: alert, no obvious focal deficits Psych: Normal affect and mood  ASSESSMENT/PLAN:   Assessment  & Plan Type 2 diabetes mellitus without complication, without long-term current use of insulin  (HCC) A1c 7.6, improved from prior.  Doing well on metformin  but desires additional weight loss therefore will retrial GLP-1 therapy. - Start Ozempic  0.25 mg weekly for one month, then increase to 0.5 mg weekly - Follow-up A1c in 3 months -Recommended to restart statin, refilled rosuvastatin  10 mg daily Primary hypertension Blood pressure borderline high, recent readings around SBP 140s at home.  -Start amlodipine-olmesartan 5-20 mg daily - Monitor blood pressure at home and maintain a log. - Follow up in 2-3 weeks for BMP and BP check Chronic pain of right ankle Pain improved, likely tendon irritation.  No instability upon exam, low concern for fracture or ligamentous tear - Continue lace-up ankle brace with activity for 4-6 weeks. - Perform home mobility and strengthening exercises. - Use Tylenol  as needed. - Return if pain persists after 4 to 6 weeks    Dr. Izetta Nap, DO [facility], [facility]

## 2024-07-21 NOTE — Assessment & Plan Note (Signed)
 Blood pressure borderline high, recent readings around SBP 140s at home.  -Start amlodipine-olmesartan 5-20 mg daily - Monitor blood pressure at home and maintain a log. - Follow up in 2-3 weeks for BMP and BP check

## 2024-07-21 NOTE — Patient Instructions (Addendum)
 It was wonderful to see you today! Thank you for choosing [facility].   Please bring ALL of your medications with you to every visit.   Today we talked about:  Your A1c is 7.6, great work!  I do think since you want to lose some weight we should try the Ozempic , as we discussed you can I do 0.25 mg for the first 4 weeks and then you can increase to 0.5 mg dosing.  If your A1c continues to do well then I think we can eventually get you off the metformin  and just keep you on the Ozempic . For your right ankle pain I think the supportive shoes with cushioning and wearing the ankle brace with activity will help.  Whenever you are at home I recommend doing ankle exercises as I have included in this packet once or twice per day.  You can use Tylenol  as needed for pain relief.  If you continue to have symptoms after 4 to 6 weeks please come back and see me we can discuss other options. Your blood pressure was a little bit high in clinic today, I do recommend that you check it at home a couple of times per week so we get an idea if you need medication or not.  The goal would be less than 130/80.  Your blood pressure has been borderline for some time so we should consider starting medication at follow-up as this does reduce your risk of heart attack and stroke long-term.  Please follow up in 1 month for blood pressure check and ankle pain  If you haven't already, sign up for My Chart to have easy access to your labs results, and communication with your primary care physician.   We are checking some labs today. If they are abnormal, I will call you. If they are normal, I will send you a MyChart message (if it is active) or a letter in the mail. If you do not hear about your labs in the next 2 weeks, please call the office.  Call the clinic at [phone] if your symptoms worsen or you have any concerns.  Please be sure to schedule follow up at the front desk before you leave today.    Keith Nap, DO Family Medicine

## 2024-07-22 ENCOUNTER — Other Ambulatory Visit (HOSPITAL_COMMUNITY): Payer: Self-pay

## 2024-07-24 LAB — MICROALBUMIN / CREATININE URINE RATIO
Creatinine, Urine: 90 mg/dL
Microalb/Creat Ratio: 202 mg/g{creat} — ABNORMAL HIGH (ref 0–29)
Microalbumin, Urine: 181.5 ug/mL

## 2024-07-25 ENCOUNTER — Other Ambulatory Visit (HOSPITAL_COMMUNITY): Payer: Self-pay

## 2024-07-26 ENCOUNTER — Ambulatory Visit: Payer: Self-pay | Admitting: Family Medicine

## 2024-07-26 NOTE — Telephone Encounter (Signed)
 ACR elevated to 202, likely in the setting of uncontrolled hypertension.  Initiated on olmesartan 20 mg daily at last visit, will monitor with repeat ACR in 3 to 6 months.

## 2024-08-24 ENCOUNTER — Other Ambulatory Visit (HOSPITAL_COMMUNITY): Payer: Self-pay

## 2024-09-21 ENCOUNTER — Ambulatory Visit: Payer: Self-pay

## 2024-09-21 VITALS — BP 139/70 | HR 74 | Temp 98.6°F | Ht 72.0 in | Wt 264.8 lb

## 2024-09-21 DIAGNOSIS — R051 Acute cough: Secondary | ICD-10-CM

## 2024-09-21 DIAGNOSIS — J111 Influenza due to unidentified influenza virus with other respiratory manifestations: Secondary | ICD-10-CM

## 2024-09-21 DIAGNOSIS — E669 Obesity, unspecified: Secondary | ICD-10-CM

## 2024-09-21 DIAGNOSIS — R0683 Snoring: Secondary | ICD-10-CM

## 2024-09-21 NOTE — Progress Notes (Signed)
" ° ° °  SUBJECTIVE:   CHIEF COMPLAINT / HPI:   Patient presents with continued cough and overnight wheezing after infection with the flu the last week. He managed this at home with OTC medications, primarily mucinex. He feels he has now bounced back, is at ~90%, he's been able to return to work involving lifting and moving boxes without DoE. He has been coughing up a mix of white and yellow phlegm which has been improving each day. Primary concern is that his wife notes he sounds like he's wheezing while sleeping, which is not normal for him at baseline this year. Last time he sounded like this was when he had pneumonia last winter, which caused him to be hospitalized.   His wife notes a hx of snoring / apneic episodes in previous years. She says she would have to bump him to get him to start breathing again at times. Has not been happening recently.   PERTINENT  PMH / PSH: T2DM, influenza infection, prior hospitalization due to pneumonia  OBJECTIVE:   BP 139/70   Pulse 74   Temp 98.6 F (37 C)   Ht 6' (1.829 m)   Wt 120.1 kg   SpO2 95%   BMI 35.91 kg/m   General: Awake, alert, NAD. Communicates clearly. HEENT: NCAT. MMM, Clear OP.  Cardio: RRR. Normal S1, S2. No murmur, rub, gallop.  Resp: L lung CTA. High pitched wheezes appreciated in the RLL. Normal WOB on room air.    ASSESSMENT/PLAN:   Assessment & Plan Influenza Acute cough Recent flu infection, hospitalized with post flu pneumonia last winter. Abnormal lung sounds in RLL on exam.  Reassuring clinical status Ordered CXR after shared decision making w/ patient. Consider treatment for CAP pending results.  Snoring Obesity (BMI 30-39.9) Patient with prior history of snoring and apneic episodes overnight per wife.  BMI 35  Split night sleep study to evaluate possible OSA.       Leafy Scriver, DO [facility] "

## 2024-09-22 ENCOUNTER — Other Ambulatory Visit (HOSPITAL_COMMUNITY): Payer: Self-pay

## 2024-09-28 ENCOUNTER — Ambulatory Visit
Admission: RE | Admit: 2024-09-28 | Discharge: 2024-09-28 | Disposition: A | Source: Ambulatory Visit | Attending: Family Medicine

## 2024-09-28 DIAGNOSIS — R051 Acute cough: Secondary | ICD-10-CM

## 2024-09-28 DIAGNOSIS — J111 Influenza due to unidentified influenza virus with other respiratory manifestations: Secondary | ICD-10-CM

## 2024-10-04 ENCOUNTER — Ambulatory Visit: Payer: Self-pay
